# Patient Record
Sex: Male | Born: 1965 | Race: White | Hispanic: No | Marital: Single | Smoking: Never smoker
Health system: Southern US, Community
[De-identification: ages and names within clinical notes are randomized; demographics above are authoritative.]

## PROBLEM LIST (undated history)

## (undated) DIAGNOSIS — M549 Dorsalgia, unspecified: Secondary | ICD-10-CM

## (undated) DIAGNOSIS — N289 Disorder of kidney and ureter, unspecified: Secondary | ICD-10-CM

## (undated) DIAGNOSIS — M109 Gout, unspecified: Secondary | ICD-10-CM

## (undated) DIAGNOSIS — E785 Hyperlipidemia, unspecified: Secondary | ICD-10-CM

## (undated) DIAGNOSIS — N183 Chronic kidney disease, stage 3 (moderate): Secondary | ICD-10-CM

## (undated) DIAGNOSIS — R768 Other specified abnormal immunological findings in serum: Secondary | ICD-10-CM

## (undated) DIAGNOSIS — I10 Essential (primary) hypertension: Secondary | ICD-10-CM

## (undated) HISTORY — DX: Other specified abnormal immunological findings in serum: R76.8

## (undated) HISTORY — PX: OTHER SURGICAL HISTORY: SHX169

## (undated) HISTORY — DX: Chronic kidney disease, stage 3 (moderate): N18.3

---
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
## 1984-06-21 HISTORY — PX: OTHER SURGICAL HISTORY: SHX169

## 2007-06-22 HISTORY — PX: KIDNEY STONE SURGERY: SHX686

## 2009-08-23 ENCOUNTER — Encounter: Payer: Self-pay | Admitting: Family Medicine

## 2009-10-10 ENCOUNTER — Encounter: Payer: Self-pay | Admitting: Family Medicine

## 2010-08-24 ENCOUNTER — Encounter: Payer: Self-pay | Admitting: Family Medicine

## 2010-08-27 ENCOUNTER — Encounter: Payer: Self-pay | Admitting: Family Medicine

## 2010-08-27 ENCOUNTER — Ambulatory Visit (INDEPENDENT_AMBULATORY_CARE_PROVIDER_SITE_OTHER): Payer: 59 | Admitting: Family Medicine

## 2010-08-27 DIAGNOSIS — E669 Obesity, unspecified: Secondary | ICD-10-CM | POA: Insufficient documentation

## 2010-08-27 DIAGNOSIS — I10 Essential (primary) hypertension: Secondary | ICD-10-CM

## 2010-08-27 DIAGNOSIS — M109 Gout, unspecified: Secondary | ICD-10-CM | POA: Insufficient documentation

## 2010-08-27 DIAGNOSIS — E785 Hyperlipidemia, unspecified: Secondary | ICD-10-CM | POA: Insufficient documentation

## 2010-08-27 DIAGNOSIS — N200 Calculus of kidney: Secondary | ICD-10-CM | POA: Insufficient documentation

## 2010-08-28 LAB — CONVERTED CEMR LAB
Albumin: 5.2 g/dL (ref 3.5–5.2)
CO2: 22 meq/L (ref 19–32)
Cholesterol: 165 mg/dL (ref 0–200)
Glucose, Bld: 82 mg/dL (ref 70–99)
Sodium: 141 meq/L (ref 135–145)
Total Bilirubin: 1.2 mg/dL (ref 0.3–1.2)
Total Protein: 7.6 g/dL (ref 6.0–8.3)
Triglycerides: 247 mg/dL — ABNORMAL HIGH (ref <150)
VLDL: 49 mg/dL — ABNORMAL HIGH (ref 0–40)

## 2010-08-31 ENCOUNTER — Telehealth: Payer: Self-pay | Admitting: Family Medicine

## 2010-09-01 NOTE — Assessment & Plan Note (Signed)
Summary: NOV: Get estab.    Vital Signs:  Patient profile:   45 year old male Height:      69.75 inches Weight:      271 pounds BMI:     39.31 Pulse rate:   74 / minute BP sitting:   112 / 75  (right arm) Cuff size:   large  Vitals Entered By: Avon Gully CMA, Duncan Dull) (August 27, 2010 2:57 PM) CC: NP-est care   CC:  NP-est care.  History of Present Illness: Used to [facility] since 1997 for his primary care.  he is here today to establish primary care. He does have a diagnosis of hypertension and gout. Also dyslipidemia. He has been on his current medication regimen for some time. He reports his last set of lab work was approximately 6 months ago. He is having his records forwarded here which should include his vaccination records. He does think that his last tetanus vaccine was approximately 7 years ago.  Habits & Providers  Alcohol-Tobacco-Diet     Alcohol drinks/day: 0  Exercise-Depression-Behavior     Does Patient Exercise: no     STD Risk: never     Drug Use: no  Current Medications (verified): 1)  Diovan 160 Mg Tabs (Valsartan) .... Take One Tablet By Mouth Once A Day 2)  Lipitor 20 Mg Tabs (Atorvastatin Calcium) .... Take One Tablet By Mouth Once Daily 3)  Allopurinol 100 Mg Tabs (Allopurinol) .... Take One Tablet Once Daily 4)  Baby Aspirin 81 Mg Chew (Aspirin) .... Take One Tablet By Mouth Once A Day 5)  Sodium Bicarbonate 600 Mg Tabs (Sodium Bicarbonate) .... Take One Tablet By Mouth Once A Day 6)  Hydrochlorothiazide 25 Mg Tabs (Hydrochlorothiazide) .... Take One Tablet By Mouth Once A Day  Allergies (verified): No Known Drug Allergies  Comments:  Nurse/Medical Assistant: The patient's medications and allergies were reviewed with the patient and were updated in the Medication and Allergy Lists. Avon Gully CMA, Duncan Dull) (August 27, 2010 3:02 PM)  Past History:  Past Medical History: On sodium bicarb for renal stones Dr. Rosey Bath (Nephrology)  Dr.  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  Alcohol use-no Drug use-no Regular exercise-no Does Patient Exercise:  no STD Risk:  never Drug Use:  no  Review of Systems       No fever/sweats/weakness, unexplained weight loss/gain.  + vison changes.  No difficulty hearing/ringing in ears, + hay fever/allergies.  No chest pain/discomfort, palpitations.  No Br lump/nipple discharge.  No cough/wheeze.  No blood in BM, nausea/vomiting/diarrhea.  No nighttime urination, leaking urine, unusual vaginal bleeding, discharge (penis or vagina).  No muscle/joint pain. No rash, change in mole.  No HA, memory loss.  No anxiety, sleep d/o, depression.  No easy bruising/bleeding, unexplained lump   Physical Exam  General:  Well-developed,well-nourished,in no acute distress; alert,appropriate and cooperative throughout examination.  he is obese Head:  Normocephalic and atraumatic without obvious abnormalities. No apparent alopecia or balding. Eyes:  No corneal or conjunctival inflammation noted. EOMI. Perrla.  Mouth:  Oral mucosa and oropharynx without lesions or exudates.  Teeth in good repair. Neck:  No deformities, masses, or tenderness noted.  no thyromegaly Heart:  Normal rate and regular rhythm. S1 and S2 normal without gallop, murmur, click, rub or other extra sounds. no carotid or abdominal bruits Abdomen:  Bowel sounds positive,abdomen soft and non-tender without masses, organomegaly or hernias noted. Skin:  no rashes.   Cervical Nodes:  No lymphadenopathy noted Psych:  Cognition and judgment appear intact. Alert and cooperative with normal attention span and concentration. No apparent delusions, illusions,  hallucinations   Impression & Recommendations:  Problem # 1:  HYPERTENSION, BENIGN (ICD-401.1)  his blood pressure looks fantastic today. Continue current regimen. His updated medication list for this problem includes:    Diovan 160 Mg Tabs (Valsartan) .Marland Kitchen... Take one tablet by mouth once a day    Hydrochlorothiazide 25 Mg Tabs (Hydrochlorothiazide) .Marland Kitchen... Take one tablet by mouth once a day  Orders: T-Comprehensive Metabolic Panel [id number]) T-Lipid Profile ([id number])  BP today: 112/75  Problem # 2:  HYPERLIPIDEMIA (ICD-272.4)  note he is currently quartering and 80 mg tab. This was a cold bottle of his mother's. He is to recheck his levels to make sure he is at goal. I did let him know the Lipitor is now generic and thus will likely be more affordable when he gets a new prescription. His updated medication list for this problem includes:    Lipitor 20 Mg Tabs (Atorvastatin calcium) .Marland Kitchen... Take one tablet by mouth once daily  Orders: T-Comprehensive Metabolic Panel [id number]) T-Lipid Profile ([id number])  Problem # 3:  GOUT, UNSPECIFIED (ICD-274.9)  he is currently well-controlled. 2 check uric acid. His updated medication list for this problem includes:    Allopurinol 100 Mg Tabs (Allopurinol) .Marland Kitchen... Take one tablet once daily  Orders: T-Comprehensive Metabolic Panel [id number]) T-Lipid Profile ([id number])  Problem # 4:  OBESITY (ICD-278.00)  we discussed potentially scheduling him with a nutritionist since he needs a low salt diet for his high blood pressure and a low purine diet for his gout. In addition to the fact that he would like to lose weight. Patient says she will think about this and check with his insurance and he'll let me know. I did encourage him to schedule a physical in the next couple of months.  Complete Medication List: 1)  Diovan 160 Mg Tabs (Valsartan) .... Take one tablet by mouth once a day 2)  Lipitor 20 Mg Tabs (Atorvastatin calcium) ....  Take one tablet by mouth once daily 3)  Allopurinol 100 Mg Tabs (Allopurinol) .... Take one tablet once daily 4)  Baby Aspirin 81 Mg Chew (Aspirin) .... Take one tablet by mouth once a day 5)  Sodium Bicarbonate 600 Mg Tabs (sodium Bicarbonate)  .... Take one tablet by mouth once a day 6)  Hydrochlorothiazide 25 Mg Tabs (Hydrochlorothiazide) .... Take one tablet by mouth once a day  Other Orders: T-Uric Acid (Blood) ([id number])  Patient Instructions: 1)  Please schedule a follow-up appointment in 6 months for your Blood pressure and cholesterol.    Orders Added: 1)  T-Comprehensive Metabolic Panel [[id number]] 2)  T-Lipid Profile [[id number]] 3)  T-Uric Acid (Blood) [[id number]] 4)  New Patient Level III [51884]

## 2010-09-08 NOTE — Progress Notes (Signed)
Summary: allopurinol Rx  Phone Note Refill Request Call back at Home Phone [phone] Message from:  Patient on August 31, 2010 9:12 AM  Refills Requested: Medication #1:  ALLOPURINOL 300 MG TABS Take 1/2 tablet by mouth once a day for 2 weeks then inc to whole tab.   Brand Name Necessary? No   Supply Requested: 1 month pt would like to take 100 MG twice a day, pls contact patient if dosage is incorrect    Method Requested: Electronic Initial call taken by: Lannette Donath,  August 31, 2010 9:12 AM  Follow-up for Phone Call        That is fine. Will change.  Follow-up by: Nani Gasser MD,  August 31, 2010 9:44 AM  Additional Follow-up for Phone Call Additional follow up Details #1::        pt notified Additional Follow-up by: Avon Gully CMA, Duncan Dull),  September 01, 2010 11:53 AM    New/Updated Medications: ALLOPURINOL 100 MG TABS (ALLOPURINOL) Take 1 tablet by mouth two times a day Prescriptions: ALLOPURINOL 100 MG TABS (ALLOPURINOL) Take 1 tablet by mouth two times a day  #60 x 2   Entered and Authorized by:   Nani Gasser MD   Signed by:   Nani Gasser MD on 08/31/2010   Method used:   Electronically to        [facility] [phone]* (retail)       [address] [place], [place]  [address]       Ph: [phone] or [phone]       Fax: [phone]   RxID:   [id number]

## 2010-09-08 NOTE — Letter (Signed)
Summary: [facility] [place]   Imported By: Lanelle Bal 09/03/2010 10:16:13  _____________________________________________________________________  External Attachment:    Type:   Image     Comment:   External Document

## 2010-09-08 NOTE — Letter (Signed)
Summary: Patient Information Form  Patient Information Form   Imported By: Maryln Gottron 08/31/2010 15:08:57  _____________________________________________________________________  External Attachment:    Type:   Image     Comment:   External Document

## 2010-09-08 NOTE — Letter (Signed)
Summary: Registration, Billing Information   Registration, Billing Information   Imported By: Maryln Gottron 08/31/2010 15:12:27  _____________________________________________________________________  External Attachment:    Type:   Image     Comment:   External Document

## 2010-10-30 ENCOUNTER — Encounter: Payer: Self-pay | Admitting: Emergency Medicine

## 2010-10-30 ENCOUNTER — Inpatient Hospital Stay (INDEPENDENT_AMBULATORY_CARE_PROVIDER_SITE_OTHER)
Admission: RE | Admit: 2010-10-30 | Discharge: 2010-10-30 | Disposition: A | Discharge status: Home / Self Care | Payer: BC Managed Care – PPO | Source: Ambulatory Visit | Attending: Emergency Medicine | Admitting: Emergency Medicine

## 2010-10-30 DIAGNOSIS — M549 Dorsalgia, unspecified: Secondary | ICD-10-CM

## 2010-10-30 DIAGNOSIS — N200 Calculus of kidney: Secondary | ICD-10-CM

## 2010-10-30 DIAGNOSIS — I10 Essential (primary) hypertension: Secondary | ICD-10-CM | POA: Insufficient documentation

## 2010-10-30 LAB — CONVERTED CEMR LAB
Bilirubin Urine: NEGATIVE
Glucose, Urine, Semiquant: NEGATIVE
Protein, U semiquant: NEGATIVE
Urobilinogen, UA: 0.2
WBC Urine, dipstick: NEGATIVE

## 2010-11-13 ENCOUNTER — Ambulatory Visit (INDEPENDENT_AMBULATORY_CARE_PROVIDER_SITE_OTHER): Payer: BC Managed Care – PPO | Admitting: Family Medicine

## 2010-11-13 ENCOUNTER — Encounter: Payer: Self-pay | Admitting: Family Medicine

## 2010-11-13 DIAGNOSIS — L989 Disorder of the skin and subcutaneous tissue, unspecified: Secondary | ICD-10-CM

## 2010-11-13 DIAGNOSIS — M109 Gout, unspecified: Secondary | ICD-10-CM

## 2010-11-13 DIAGNOSIS — I10 Essential (primary) hypertension: Secondary | ICD-10-CM

## 2010-11-13 DIAGNOSIS — N200 Calculus of kidney: Secondary | ICD-10-CM

## 2010-11-13 DIAGNOSIS — E785 Hyperlipidemia, unspecified: Secondary | ICD-10-CM

## 2010-11-13 MED ORDER — ATORVASTATIN CALCIUM 40 MG PO TABS: 20.0000 mg | ORAL_TABLET | Freq: Every day | ORAL | Status: DC

## 2010-11-13 MED ORDER — ALLOPURINOL 100 MG PO TABS: 100.0000 mg | ORAL_TABLET | Freq: Two times a day (BID) | ORAL | Status: DC

## 2010-11-13 MED ORDER — VALSARTAN 160 MG PO TABS: 160.0000 mg | ORAL_TABLET | Freq: Every day | ORAL | Status: DC

## 2010-11-13 MED ORDER — ALLOPURINOL 100 MG PO TABS: 100.0000 mg | ORAL_TABLET | Freq: Every day | ORAL | Status: DC

## 2010-11-13 MED ORDER — SODIUM BICARBONATE 650 MG PO TABS: 650.0000 mg | ORAL_TABLET | Freq: Two times a day (BID) | ORAL | Status: DC

## 2010-11-13 MED ORDER — VALSARTAN 320 MG PO TABS: 160.0000 mg | ORAL_TABLET | Freq: Every day | ORAL | Status: DC

## 2010-11-13 MED ORDER — SODIUM BICARBONATE 325 MG PO TABS: 600.0000 mg | ORAL_TABLET | Freq: Every day | ORAL | Status: DC

## 2010-11-13 NOTE — Assessment & Plan Note (Signed)
Since he is unable to take fish oil to help reduce his triglycerides and I did recommend working on regular exercise and cutting back on fast food. He said his heart he made some changes in that direction and encouraged him to continue to do so. We can recheck his triglycerides in 6 months. If not a colon can consider adding TriCor. Continue the statin.

## 2010-11-13 NOTE — Progress Notes (Signed)
  Subjective:    Patient ID: Jared Harper, male    DOB: [birth year], 45 y.o.   MRN: [id number]  Hypertension This is a chronic problem. The current episode started more than 1 year ago. The problem is unchanged. The problem is controlled. Pertinent negatives include no chest pain, palpitations, peripheral edema or shortness of breath. There are no associated agents to hypertension. Risk factors for coronary artery disease include no known risk factors. Past treatments include ACE inhibitors.   At last OV went up to 2 tabs on his allopurinol. He is doing well with this. He has not had any gout flare since I last saw him. He has been tolerating the medication well without any side effects on increased dose.  Hyperlipidemia - Unable to tolerate fish oil. Gives him bad gas. Says he knows he eats too much fastfood.  He is on jobs all the time. Not exercising regularly.  Has lost 4 lbs. he continues his Lipitor and his last LDL and total cholesterol were well controlled.  He would also like to have a Urology referral. Used to see Dr. Clenton Pare for his kidney stones  He also has skin lesion on the top for his head that was followed at [facility]. Was there for the last 3 years. Would like me to measure it and track it.   Review of Systems  Respiratory: Negative for shortness of breath.   Cardiovascular: Negative for chest pain and palpitations.       Objective:   Physical Exam  Constitutional: He is oriented to person, place, and time. He appears well-developed and well-nourished.  HENT:  Head: Normocephalic and atraumatic.  Eyes: Conjunctivae are normal.  Neck: Neck supple. No thyromegaly present.  Cardiovascular: Normal rate, regular rhythm and normal heart sounds.   Pulmonary/Chest: Effort normal and breath sounds normal.  Lymphadenopathy:    He has no cervical adenopathy.  Neurological: He is alert and oriented to person, place, and time.  Skin: Skin is warm and dry.  Psychiatric: He  has a normal mood and affect. His behavior is normal. Judgment and thought content normal.          Assessment & Plan:

## 2010-11-13 NOTE — Assessment & Plan Note (Signed)
Well-controlled on increased dose of allopurinol. Recheck uric acid level today to make sure he is to call. He says he doesn't need a refill on his colchicine quite yet.

## 2010-11-13 NOTE — Assessment & Plan Note (Signed)
Blood pressure is well-controlled today. Continue current regimen. I did send over 90 day prescription with a coupon part which makes a $25 for 90 day supply. This will make it much more affordable. He i have done well on this medications and I would hate to change it.

## 2010-11-17 ENCOUNTER — Telehealth: Payer: Self-pay | Admitting: Family Medicine

## 2010-11-17 NOTE — Telephone Encounter (Signed)
Call patient: Uric acid is down to 6.5 which is perfect. Continue current regimen.

## 2010-11-17 NOTE — Telephone Encounter (Signed)
Pt.notified

## 2011-04-06 ENCOUNTER — Telehealth: Payer: Self-pay | Admitting: Family Medicine

## 2011-04-06 NOTE — Telephone Encounter (Signed)
Pt calling to check the last time he had his kidney function scheduled and done.  Also needed an appt scheduled for the kidney function and herniated disk. Plan:  Appt scheduled. Jarvis Newcomer, LPN Domingo Dimes

## 2011-04-16 ENCOUNTER — Ambulatory Visit (INDEPENDENT_AMBULATORY_CARE_PROVIDER_SITE_OTHER): Payer: BC Managed Care – PPO | Admitting: Family Medicine

## 2011-04-16 ENCOUNTER — Encounter: Payer: Self-pay | Admitting: Family Medicine

## 2011-04-16 VITALS — BP 114/79 | HR 73 | Wt 272.0 lb

## 2011-04-16 DIAGNOSIS — M5440 Lumbago with sciatica, unspecified side: Secondary | ICD-10-CM

## 2011-04-16 DIAGNOSIS — N189 Chronic kidney disease, unspecified: Secondary | ICD-10-CM

## 2011-04-16 DIAGNOSIS — M5430 Sciatica, unspecified side: Secondary | ICD-10-CM

## 2011-04-16 DIAGNOSIS — E785 Hyperlipidemia, unspecified: Secondary | ICD-10-CM

## 2011-04-16 DIAGNOSIS — N1832 Chronic kidney disease, stage 3b: Secondary | ICD-10-CM | POA: Insufficient documentation

## 2011-04-16 DIAGNOSIS — Z23 Encounter for immunization: Secondary | ICD-10-CM

## 2011-04-16 HISTORY — DX: Chronic kidney disease, stage 3b: N18.32

## 2011-04-16 MED ORDER — CYCLOBENZAPRINE HCL 10 MG PO TABS: 10.0000 mg | ORAL_TABLET | Freq: Every evening | ORAL | Status: DC | PRN

## 2011-04-16 NOTE — Progress Notes (Signed)
  Subjective:    Patient ID: Jared Harper, male    DOB: June 12, 1966, 45 y.o.   MRN: [id number]  HPI Hx of low back pain.  Says was told in the past he has a herniated disc. Had an xray done at [facility] about 1-2 years ago.  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  Pain shoots into the right leg from the buttock to the knee.  He is overweight.  No meds.  Using some heat. Not doing his stretches. Standing seems to help his pain.  NEver completed formal PT. Used muscle relaxers at one time.   CKD- He wants to check his kidney function. He has been avoiding NSAID and is trying to eat healthy.   Review of Systems     Objective:   Physical Exam  Constitutional: He is oriented to person, place, and time. He appears well-developed and well-nourished.  Musculoskeletal: He exhibits no edema.       Low back with nl flexion, extension, rotation right and left and side bending. Nontender over the lumbar spine. Tightness over the parapsinous muscles and mild tenderness. Neg Si joint tenderness.  Neg straight leg raise. Hip, knee and ankle strength is 5/5.  Neurological: He is alert and oriented to person, place, and time.  Skin: Skin is warm and dry.  Psychiatric: He has a normal mood and affect. His behavior is normal.          Assessment & Plan:  Low Back pain with sciatica - New dx. Discussed tx with PT. He wanted to start with home stretches first. Can use Tylenol for pain relief. Will add muscle relaxer at night adn use heat before the stretches. B/O kidney dz hold on NSAIDs for now.  Note, + hx for gout.   CKD- due to recheck kidney function. He is also due for lipid screeningn. Lab slip given   Flu vaccine given today.

## 2011-04-16 NOTE — Patient Instructions (Signed)
Can use the muscle relaxer in the evening Apply heat for about 15 min at a time and then do your gentle stretches.  If not improving over the next 3 weeks then call and we will put you into physical therapy.

## 2011-04-17 LAB — COMPREHENSIVE METABOLIC PANEL
ALT: 47 U/L (ref 0–53)
AST: 30 U/L (ref 0–37)
BUN: 17 mg/dL (ref 6–23)
Calcium: 10 mg/dL (ref 8.4–10.5)
Chloride: 102 mEq/L (ref 96–112)
Creat: 1.39 mg/dL — ABNORMAL HIGH (ref 0.50–1.35)
Total Bilirubin: 0.9 mg/dL (ref 0.3–1.2)

## 2011-04-17 LAB — LIPID PANEL
Cholesterol: 161 mg/dL (ref 0–200)
HDL: 31 mg/dL — ABNORMAL LOW (ref >39)
Total CHOL/HDL Ratio: 5.2 Ratio
VLDL: 57 mg/dL — ABNORMAL HIGH (ref 0–40)

## 2011-05-07 ENCOUNTER — Ambulatory Visit: Payer: BC Managed Care – PPO | Admitting: Family Medicine

## 2011-05-07 DIAGNOSIS — Z0289 Encounter for other administrative examinations: Secondary | ICD-10-CM

## 2011-05-18 ENCOUNTER — Other Ambulatory Visit: Payer: Self-pay | Admitting: Family Medicine

## 2011-05-24 NOTE — Progress Notes (Signed)
Summary: POSSIBLE KIDNEY STONE? rm 4   Vital Signs:  Patient Profile:   45 Years Old Male CC:      possible kidney stone x 5 days Height:     69.75 inches Weight:      277 pounds O2 Sat:      98 % O2 treatment:    Room Air Temp:     98.0 degrees F oral Pulse rate:   112 / minute Resp:     18 per minute BP sitting:   123 / 81  (left arm) Cuff size:   large  Vitals Entered By: Clemens Catholic LPN (Oct 30, 2010 10:42 AM)                  Updated Prior Medication List: DIOVAN 160 MG TABS (VALSARTAN) take one tablet by mouth once a day LIPITOR 20 MG TABS (ATORVASTATIN CALCIUM) take one tablet by mouth once daily ALLOPURINOL 100 MG TABS (ALLOPURINOL) Take 1 tablet by mouth two times a day BABY ASPIRIN 81 MG CHEW (ASPIRIN) take one tablet by mouth once a day * SODIUM BICARBONATE 600 MG TABS (SODIUM BICARBONATE) take one tablet by mouth once a day FISH OIL 1000 MG CAPS (OMEGA-3 FATTY ACIDS) 3 caps by mouth daily  Current Allergies (reviewed today): No known allergies History of Present Illness History from: patient Chief Complaint: possible kidney stone x 5 days History of Present Illness: Patient with back pain on the R side.  He has a history of kidney stones on that side and was told by his urologist that there are more.  He hasn't noticed any hematuria or dysuria or other UTI symptoms.  In the past he has also had a creatinine level of 9 during one of his stones.  He doesn't recall any trauma, heavy lifting, or other reason why he would have back pain.  He has been using Tylenol and heating pad which helps a little.  No F/C/N/V.  Pain doesn't radiate.  He also has a history of a slipped disc but is unsure which level.  REVIEW OF SYSTEMS Constitutional Symptoms      Denies fever, chills, night sweats, weight loss, weight gain, and fatigue.  Eyes       Denies change in vision, eye pain, eye discharge, glasses, contact lenses, and eye surgery. Ear/Nose/Throat/Mouth       Denies  hearing loss/aids, change in hearing, ear pain, ear discharge, dizziness, frequent runny nose, frequent nose bleeds, sinus problems, sore throat, hoarseness, and tooth pain or bleeding.  Respiratory       Denies dry cough, productive cough, wheezing, shortness of breath, asthma, bronchitis, and emphysema/COPD.  Cardiovascular       Denies murmurs, chest pain, and tires easily with exhertion.    Gastrointestinal       Denies stomach pain, nausea/vomiting, diarrhea, constipation, blood in bowel movements, and indigestion. Genitourniary       Denies painful urination, kidney stones, and loss of urinary control. Neurological       Denies paralysis, seizures, and fainting/blackouts. Musculoskeletal       Denies muscle pain, joint pain, joint stiffness, decreased range of motion, redness, swelling, muscle weakness, and gout.  Skin       Denies bruising, unusual mles/lumps or sores, and hair/skin or nail changes.  Psych       Denies mood changes, temper/anger issues, anxiety/stress, speech problems, depression, and sleep problems. Other Comments: pt c/o RT sided LBP x 5 days. hx of kidney stones.  no pain with urination, no blood in his urine. Tuesday AM he had a little pain with urination ? passed small stone. he has taken Tylenol ex strength and used a heating pad.   Past History:  Past Medical History: On sodium bicarb for renal stones Dr. Rosey Bath (Nephrology)  Dr. Clenton Pare (Urology) Gout Hyperlipidemia Hypertension  Past Surgical History: Hand srugeyr 1979, left hand carpel tunnel Thyroid ductal cyst 1985 Cyst, benign on leg 1986 laser for kidney stone 2009  Family History: Reviewed history from 08/27/2010 and no changes required. Aunt with BrCA Brother wht DM GF with DM Uncle with stroke MGP wtih Hi chol  Social History: Reviewed history from 08/27/2010 and no changes required. 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  Alcohol use-no Drug  use-no Regular exercise-no Physical Exam General appearance: well developed, well nourished, no acute distress Abdomen: soft, non-tender without obvious organomegaly Back: mild CVA tendernes R flank, no midline spinal pain MSE: oriented to time, place, and person Assessment New Problems: BACK PAIN (ICD-724.5) HYPERTENSION (ICD-401.9)   Patient Education: Patient and/or caregiver instructed in the following: rest, fluids.  Plan New Orders: New Patient Level III [99203] T-Basic Metabolic Panel [phone] Planning Comments:   Pt with back pain.  UA shows trace blood.  DDx included kidney stone vs lumbar muscle strain.  Continue heating pad and Tylenol.  He can't take NSAIDs due to kidney problems but has taken Norco in the past for stones but not for 3 years.  No meds given today.  I gave him a strainer to use to see if he can catch any sediment.  Numbers given for urologists locally.  Also will check a STAT BMP to see his creatinine level.      The patient and/or caregiver has been counseled thoroughly with regard to medications prescribed including dosage, schedule, interactions, rationale for use, and possible side effects and they verbalize understanding.  Diagnoses and expected course of recovery discussed and will return if not improved as expected or if the condition worsens. Patient and/or caregiver verbalized understanding.   Orders Added: 1)  New Patient Level III [99203] 2)  T-Basic Metabolic Panel [phone]    Laboratory Results   Urine Tests  Date/Time Received: Oct 30, 2010 11:00 AM  Date/Time Reported: Oct 30, 2010 11:00 AM   Routine Urinalysis   Color: yellow Appearance: Clear Glucose: negative   (Normal Range: Negative) Bilirubin: negative   (Normal Range: Negative) Ketone: negative   (Normal Range: Negative) Spec. Gravity: 1.025   (Normal Range: 1.003-1.035) Blood: trace-lysed   (Normal Range: Negative) pH: 5.5   (Normal Range: 5.0-8.0) Protein:  negative   (Normal Range: Negative) Urobilinogen: 0.2   (Normal Range: 0-1) Nitrite: negative   (Normal Range: Negative) Leukocyte Esterace: negative   (Normal Range: Negative)

## 2011-07-07 ENCOUNTER — Other Ambulatory Visit: Payer: Self-pay | Admitting: Family Medicine

## 2012-01-22 ENCOUNTER — Other Ambulatory Visit: Payer: Self-pay | Admitting: Family Medicine

## 2012-01-24 NOTE — Telephone Encounter (Signed)
Must make appointment 

## 2012-03-15 ENCOUNTER — Ambulatory Visit (INDEPENDENT_AMBULATORY_CARE_PROVIDER_SITE_OTHER): Payer: BC Managed Care – PPO | Admitting: Family Medicine

## 2012-03-15 ENCOUNTER — Encounter: Payer: Self-pay | Admitting: Family Medicine

## 2012-03-15 VITALS — BP 114/81 | HR 67 | Wt 273.0 lb

## 2012-03-15 DIAGNOSIS — Z23 Encounter for immunization: Secondary | ICD-10-CM

## 2012-03-15 DIAGNOSIS — M109 Gout, unspecified: Secondary | ICD-10-CM

## 2012-03-15 DIAGNOSIS — E785 Hyperlipidemia, unspecified: Secondary | ICD-10-CM

## 2012-03-15 DIAGNOSIS — I10 Essential (primary) hypertension: Secondary | ICD-10-CM

## 2012-03-15 DIAGNOSIS — Z131 Encounter for screening for diabetes mellitus: Secondary | ICD-10-CM

## 2012-03-15 LAB — COMPLETE METABOLIC PANEL WITH GFR
ALT: 60 U/L — ABNORMAL HIGH (ref 0–53)
BUN: 14 mg/dL (ref 6–23)
CO2: 27 mEq/L (ref 19–32)
Calcium: 9.9 mg/dL (ref 8.4–10.5)
Chloride: 104 mEq/L (ref 96–112)
Creat: 1.44 mg/dL — ABNORMAL HIGH (ref 0.50–1.35)
GFR, Est African American: 66 mL/min
GFR, Est Non African American: 57 mL/min — ABNORMAL LOW
Glucose, Bld: 86 mg/dL (ref 70–99)
Total Bilirubin: 1 mg/dL (ref 0.3–1.2)

## 2012-03-15 LAB — POCT URINALYSIS DIPSTICK
Bilirubin, UA: NEGATIVE
Blood, UA: NEGATIVE
Leukocytes, UA: NEGATIVE
Nitrite, UA: NEGATIVE
Protein, UA: NEGATIVE
pH, UA: 6

## 2012-03-15 LAB — LIPID PANEL
Cholesterol: 135 mg/dL (ref 0–200)
HDL: 33 mg/dL — ABNORMAL LOW (ref >39)
LDL Cholesterol: 64 mg/dL (ref 0–99)
Triglycerides: 189 mg/dL — ABNORMAL HIGH (ref <150)

## 2012-03-15 LAB — HEMOGLOBIN A1C
Hgb A1c MFr Bld: 5.5 % (ref <5.7)
Mean Plasma Glucose: 111 mg/dL (ref <117)

## 2012-03-15 MED ORDER — CYCLOBENZAPRINE HCL 10 MG PO TABS: 10.0000 mg | ORAL_TABLET | Freq: Two times a day (BID) | ORAL | Status: DC | PRN

## 2012-03-15 MED ORDER — ATORVASTATIN CALCIUM 40 MG PO TABS: 40.0000 mg | ORAL_TABLET | Freq: Every day | ORAL | Status: DC

## 2012-03-15 MED ORDER — VALSARTAN 160 MG PO TABS: 160.0000 mg | ORAL_TABLET | Freq: Every day | ORAL | Status: DC

## 2012-03-15 MED ORDER — COLCHICINE 0.6 MG PO TABS: 0.6000 mg | ORAL_TABLET | Freq: Every day | ORAL | Status: DC

## 2012-03-15 MED ORDER — ALLOPURINOL 100 MG PO TABS: 100.0000 mg | ORAL_TABLET | Freq: Two times a day (BID) | ORAL | Status: DC

## 2012-03-15 NOTE — Progress Notes (Signed)
  Subjective:    Patient ID: Jared Harper, male    DOB: 09-May-1966, 46 y.o.   MRN: [id number]  HPI HTN- No CP or SOB.  Getting some exercise. Taking meds without SE.    Hyperlipidemia - doing well on current medications. No myalgias. He has not been eating the best of the last week or 2 because he went on a cruise. He has been trying to walk 30 minutes every evening.  He also notes that his urine has smelled like sugar smacks for the last 4-5 months. He says this is new. He denies any foul smell to the order. He denies any polyuria or polydipsia. He does have a strong family history of diabetes but he has never been diagnosed with diabetes himself.  Gout - no recent flaresReview of Systems     Objective:   Physical Exam  Constitutional: He is oriented to person, place, and time. He appears well-developed and well-nourished.  HENT:  Head: Normocephalic and atraumatic.  Cardiovascular: Normal rate, regular rhythm and normal heart sounds.   Pulmonary/Chest: Effort normal and breath sounds normal.  Neurological: He is alert and oriented to person, place, and time.  Skin: Skin is warm and dry.  Psychiatric: He has a normal mood and affect. His behavior is normal.          Assessment & Plan:  HTN - Well controlled. Continue current regimen. Scription sent to pharmacy. Coupon card given for the Diovan. Follow up in 6 months.  Hyperlipidemai - recheck lipids Lahey still well controlled. Refill sent to pharmacy. Continue work on diet and exercise and weight loss.  Sweet smelling urine-Check urinalysis and hemoglobin A1c to rule out diabetes.  Gout-he's doing very well. No recent flares. He takes his allopurinol 2 tabs daily and only use the colchicine for rescue. Refills sent to pharmacy. Recheck uric acid level.  Flu shot given.

## 2012-05-09 ENCOUNTER — Telehealth: Payer: Self-pay | Admitting: Family Medicine

## 2012-05-09 NOTE — Telephone Encounter (Signed)
Please call patient and see if he is still noting seeing a sweet smell to his urine and or if it has resolved.

## 2012-05-10 NOTE — Telephone Encounter (Signed)
OK, I think likely he if fine but if becomes more persistant then I can send him to urology for further evaluation.

## 2012-05-10 NOTE — Telephone Encounter (Signed)
Patient states the smell comes and goes. He is feeling better now that he is taking a teaspoon of honey BID.

## 2012-05-11 NOTE — Telephone Encounter (Signed)
Left message on pt.'s vm.

## 2012-08-01 ENCOUNTER — Encounter: Payer: Self-pay | Admitting: *Deleted

## 2012-08-01 ENCOUNTER — Emergency Department (INDEPENDENT_AMBULATORY_CARE_PROVIDER_SITE_OTHER)
Admission: EM | Admit: 2012-08-01 | Discharge: 2012-08-01 | Disposition: A | Discharge status: Other Acute Inpatient Hospital | Payer: BC Managed Care – PPO | Source: Home / Self Care | Attending: Family Medicine | Admitting: Family Medicine

## 2012-08-01 DIAGNOSIS — R079 Chest pain, unspecified: Secondary | ICD-10-CM

## 2012-08-01 HISTORY — DX: Hyperlipidemia, unspecified: E78.5

## 2012-08-01 HISTORY — DX: Disorder of kidney and ureter, unspecified: N28.9

## 2012-08-01 HISTORY — DX: Gout, unspecified: M10.9

## 2012-08-01 HISTORY — DX: Essential (primary) hypertension: I10

## 2012-08-01 MED ORDER — NITROGLYCERIN 0.4 MG SL SUBL
0.4000 mg | SUBLINGUAL_TABLET | SUBLINGUAL | Status: DC | PRN
  Administered 2012-08-01: 0.4 mg via SUBLINGUAL

## 2012-08-01 MED ORDER — ASPIRIN 81 MG PO CHEW
324.0000 mg | CHEWABLE_TABLET | Freq: Once | ORAL | Status: AC
  Administered 2012-08-01: 324 mg via ORAL

## 2012-08-01 NOTE — ED Provider Notes (Addendum)
History     CSN: [id number]  Arrival date & time 08/01/12  1625   First MD Initiated Contact with Patient 08/01/12 1633      Chief Complaint  Patient presents with  . Chest Pain   HPI  Patient presents today with chief complaint of chest pain. Patient states chest pains are present for the past 3 days. 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 Patient states the chest pain is central nature with radiation to the neck as well as to the shoulder. No associated diaphoresis or nausea. Patient states pain is more but tightness and is 5/10 at its worse. Pain is intermittent intermittent in nature with no alleviating or aggravating factors. Pain is not precipitated by exertion and not relieved with rest. Patient states he had a stress test approximately 14-15 years ago that was normal. However, patient has not had any other cardiology followup this. Patient is taking a baby aspirin since this morning.  Past Medical History  Diagnosis Date  . Hepatitis C antibody test positive     Though has had neg tests as well.   . Hypertension   . Hyperlipidemia   . Gout   . Kidney disease     Past Surgical History  Procedure Laterality Date  . Staph infection surgery x 3    . Carpal tunnel release  1979    left   . Thyroglossal duct cyst  1985  . Cyst removal on leg  1986  . Kidney stone surgery  2009    laser    Family History  Problem Relation Age of Onset  . Breast cancer      aunt  . Diabetes Brother   . Diabetes      grandfather  . Stroke      uncel   . Hyperlipidemia Maternal Grandfather   . Heart attack Maternal Grandfather     History  Substance Use Topics  . Smoking status: Never Smoker   . Smokeless tobacco: Current User     Comment: Snuff/dip  . Alcohol Use: No      Review of Systems  All other systems reviewed and are negative.    Allergies  Review of patient's allergies indicates no known allergies.  Home  Medications   Current Outpatient Rx  Name  Route  Sig  Dispense  Refill  . allopurinol (ZYLOPRIM) 100 MG tablet   Oral   Take 1 tablet (100 mg total) by mouth 2 (two) times daily.   180 tablet   1   . aspirin 81 MG tablet   Oral   Take 81 mg by mouth daily.           Marland Kitchen atorvastatin (LIPITOR) 40 MG tablet   Oral   Take 1 tablet (40 mg total) by mouth daily.   90 tablet   3   . colchicine 0.6 MG tablet   Oral   Take 1 tablet (0.6 mg total) by mouth daily.   90 tablet   0   . cyclobenzaprine (FLEXERIL) 10 MG tablet   Oral   Take 1 tablet (10 mg total) by mouth 2 (two) times daily as needed for muscle spasms.   20 tablet   0     Must make appointment   . sodium bicarbonate 650 MG tablet   Oral   Take 1 tablet (650 mg total) by mouth 2 (two) times daily.   60 tablet   6   .  valsartan (DIOVAN) 160 MG tablet   Oral   Take 1 tablet (160 mg total) by mouth daily.   90 tablet   1     BP 131/91  Pulse 105  Wt 274 lb (124.286 kg)  BMI 39.58 kg/m2  SpO2 98%  Physical Exam  Constitutional:  Obese  HENT:  Head: Normocephalic and atraumatic.  Right Ear: External ear normal.  Left Ear: External ear normal.  Eyes: Conjunctivae are normal. Pupils are equal, round, and reactive to light.  Neck: Normal range of motion. Neck supple.  Cardiovascular: Normal rate and regular rhythm.   Pulmonary/Chest: Effort normal.  Abdominal: Soft.  Musculoskeletal: Normal range of motion.  Neurological: He is alert.  Skin: Skin is warm.    ED Course  Procedures (including critical care time)  Labs Reviewed - No data to display No results found.   1. Chest pain    EKG: NSR, no ST or T wave abnormalities.     MDM  Noted multiple cardiovascular risk factors. TIMI score of 2-3. ACS protocol activated. Patient given sublingual mitral as well as full dose aspirin and supplemental oxygen. EKG normal sinus rhythm which is reassuring however patient will need at least a set  of cardiac enzymes given cardiac risk factors. EMS contacted patient transferred to the ER via EMS.  Clinical update: Patient refuses EMS. Patient states that his brothers been taken to the ER. Discussed risks and benefits at length with patient including MI and sudden cardiac death. Patient signed AMA form.  Greater than 60 minutes spent with pt in terms of care coordination and direct patient care.   The patient and/or caregiver has been counseled thoroughly with regard to treatment plan and/or medications prescribed including dosage, schedule, interactions, rationale for use, and possible side effects and they verbalize understanding. Diagnoses and expected course of recovery discussed and will return if not improved as expected or if the condition worsens. Patient and/or caregiver verbalized understanding.             Doree Albee, MD 08/01/12 1707  Doree Albee, MD 08/01/12 1708  Doree Albee, MD 08/01/12 1714

## 2012-08-01 NOTE — ED Notes (Signed)
Patient refused EMS transport to hospital. Refusal form signed. He is being driven by his brother to [facility] ER now. Report called to charge nurse.

## 2012-08-01 NOTE — ED Notes (Signed)
Patient c/o 3 days of intermittent left sided CP that radiates to his left arm. Pain is worse with movement. Denies nausea, SOB or diaphoresis. He has a + family hx of MI with his grandparents.

## 2012-08-02 ENCOUNTER — Telehealth: Payer: Self-pay | Admitting: Family Medicine

## 2012-08-02 NOTE — Telephone Encounter (Signed)
Call patient: A see he went to the urgent care for chest pain. I want to call and see if he is doing okay this morning and to see if he would be willing to be scheduled for a treadmill stress test. Last one was over 10 years ago. Please let me know he would like to do.

## 2012-08-02 NOTE — Telephone Encounter (Signed)
Called pt.  He states he is doing ok & that he wants to hold off on the treadmill stress test due to his finances.  I am calling [facility] to get his papers from the ED visit.

## 2012-09-12 ENCOUNTER — Encounter: Payer: Self-pay | Admitting: Family Medicine

## 2012-09-12 ENCOUNTER — Ambulatory Visit (INDEPENDENT_AMBULATORY_CARE_PROVIDER_SITE_OTHER): Payer: BC Managed Care – PPO | Admitting: Family Medicine

## 2012-09-12 ENCOUNTER — Other Ambulatory Visit: Payer: Self-pay | Admitting: Family Medicine

## 2012-09-12 VITALS — BP 113/74 | HR 71 | Ht 69.6 in | Wt 284.0 lb

## 2012-09-12 DIAGNOSIS — I10 Essential (primary) hypertension: Secondary | ICD-10-CM

## 2012-09-12 DIAGNOSIS — Z23 Encounter for immunization: Secondary | ICD-10-CM

## 2012-09-12 DIAGNOSIS — R7401 Elevation of levels of liver transaminase levels: Secondary | ICD-10-CM

## 2012-09-12 DIAGNOSIS — R7309 Other abnormal glucose: Secondary | ICD-10-CM

## 2012-09-12 DIAGNOSIS — R748 Abnormal levels of other serum enzymes: Secondary | ICD-10-CM

## 2012-09-12 DIAGNOSIS — E781 Pure hyperglyceridemia: Secondary | ICD-10-CM

## 2012-09-12 DIAGNOSIS — N189 Chronic kidney disease, unspecified: Secondary | ICD-10-CM

## 2012-09-12 DIAGNOSIS — M545 Low back pain, unspecified: Secondary | ICD-10-CM

## 2012-09-12 LAB — HEMOGLOBIN A1C
Hgb A1c MFr Bld: 5.9 % — ABNORMAL HIGH (ref <5.7)
Mean Plasma Glucose: 123 mg/dL — ABNORMAL HIGH (ref <117)

## 2012-09-12 LAB — COMPLETE METABOLIC PANEL WITH GFR
Alkaline Phosphatase: 86 U/L (ref 39–117)
BUN: 17 mg/dL (ref 6–23)
Creat: 1.46 mg/dL — ABNORMAL HIGH (ref 0.50–1.35)
GFR, Est African American: 65 mL/min
GFR, Est Non African American: 56 mL/min — ABNORMAL LOW
Glucose, Bld: 87 mg/dL (ref 70–99)
Sodium: 139 mEq/L (ref 135–145)
Total Bilirubin: 0.8 mg/dL (ref 0.3–1.2)
Total Protein: 7.6 g/dL (ref 6.0–8.3)

## 2012-09-12 LAB — TRIGLYCERIDES: Triglycerides: 262 mg/dL — ABNORMAL HIGH (ref <150)

## 2012-09-12 MED ORDER — TETANUS-DIPHTH-ACELL PERTUSSIS 5-2.5-18.5 LF-MCG/0.5 IM SUSP: 0.5000 mL | Freq: Once | INTRAMUSCULAR | Status: DC

## 2012-09-12 MED ORDER — ATORVASTATIN CALCIUM 20 MG PO TABS: 20.0000 mg | ORAL_TABLET | Freq: Every day | ORAL | Status: DC

## 2012-09-12 NOTE — Progress Notes (Signed)
  Subjective:    Patient ID: Jared Harper, male    DOB: [birth year], 47 y.o.   MRN: [id number]  HPI HTN-  Pt denies chest pain, SOB, dizziness, or heart palpitations.  Taking meds as directed w/o problems.  Denies medication side effects.  Was walking for exercise but has stopped due to the cold weather. He says he knows he needs to start back.. He has started [employer] Pal program.  He does take a baby aspirin.  Gout- No flares in the last 6 month. On allopurinol. No recent changes to his regimen. Tolerating the medication well without side effects.  Also due to followup abnormal labs. Liver function was slightly off. He also has CKD and atelectatic his kidney function at least twice a year. On an ARB.   Review of Systems     Objective:   Physical Exam  Constitutional: He is oriented to person, place, and time. He appears well-developed and well-nourished.  HENT:  Head: Normocephalic and atraumatic.  Cardiovascular: Normal rate, regular rhythm and normal heart sounds.   Pulmonary/Chest: Effort normal and breath sounds normal.  Neurological: He is alert and oriented to person, place, and time.  Skin: Skin is warm and dry.  Psychiatric: He has a normal mood and affect. His behavior is normal.   10mm x 19mm hyperpigmented lesion. Has a lighter brown portion that is 22 mm wide (extending fro the 10mm darker portion)       Assessment & Plan:  HTN- Well controlled. F/U in 6 months.  Encourage more regular exercise.  He is doing a great job with his diet. Continue Diovan and baby aspirin.  Hypertriglyceridemia-due to recheck triglycerides levels.  Elevated liver enzymes - Recheck to make sure coming down. He has been trying to make some good dietary changes.   Abnormal glucose - Will do an A1C.   CKD- On ARB. Will recheck CR.   Gout- well controlled.  No flares.  Continue current regimen.   He says his low back has been bothering him recently. He wanted to notify had a  handout on some exercises he can do on his own home. Given H.O for low back exercises.

## 2012-09-14 ENCOUNTER — Encounter: Payer: Self-pay | Admitting: Family Medicine

## 2012-09-14 NOTE — Telephone Encounter (Signed)
r 

## 2012-09-17 ENCOUNTER — Encounter: Payer: Self-pay | Admitting: Family Medicine

## 2012-09-19 ENCOUNTER — Other Ambulatory Visit: Payer: Self-pay | Admitting: Family Medicine

## 2012-09-19 DIAGNOSIS — D229 Melanocytic nevi, unspecified: Secondary | ICD-10-CM

## 2012-10-30 ENCOUNTER — Other Ambulatory Visit: Payer: Self-pay | Admitting: *Deleted

## 2012-10-30 MED ORDER — COLCHICINE 0.6 MG PO TABS: 0.6000 mg | ORAL_TABLET | Freq: Every day | ORAL | Status: DC

## 2012-11-28 ENCOUNTER — Telehealth: Payer: Self-pay | Admitting: *Deleted

## 2012-11-28 DIAGNOSIS — E669 Obesity, unspecified: Secondary | ICD-10-CM

## 2012-11-28 NOTE — Telephone Encounter (Signed)
Pt would like a referral to nutrition or a dietician to help with diet and weight loss. Pt has been trying to manage this on his own however, feels that he would benefit more with help.  Dr. Linford Arnold, I wasn't sure as to whether I should do this as nutrition or as dietician?    Loralee Pacas [place]

## 2012-11-28 NOTE — Telephone Encounter (Signed)
Order placed for referral.  

## 2012-11-29 NOTE — Addendum Note (Signed)
Addended by: Deno Etienne on: 11/29/2012 05:12 PM   Modules accepted: Orders

## 2012-11-29 NOTE — Telephone Encounter (Signed)
Pt called back and stated that the nutritionist that he was referred to in not in network and informed me to call [facility] ([facility] [place])

## 2012-12-18 ENCOUNTER — Encounter: Payer: Self-pay | Admitting: Family Medicine

## 2012-12-19 ENCOUNTER — Ambulatory Visit: Payer: BC Managed Care – PPO | Admitting: Dietician

## 2013-01-08 ENCOUNTER — Encounter: Payer: Self-pay | Admitting: Physician Assistant

## 2013-01-08 ENCOUNTER — Ambulatory Visit (INDEPENDENT_AMBULATORY_CARE_PROVIDER_SITE_OTHER): Payer: BC Managed Care – PPO | Admitting: Physician Assistant

## 2013-01-08 ENCOUNTER — Encounter: Payer: Self-pay | Admitting: Sports Medicine

## 2013-01-08 ENCOUNTER — Ambulatory Visit (INDEPENDENT_AMBULATORY_CARE_PROVIDER_SITE_OTHER): Payer: BC Managed Care – PPO | Admitting: Sports Medicine

## 2013-01-08 VITALS — BP 114/80 | HR 74 | Wt 273.0 lb

## 2013-01-08 DIAGNOSIS — M109 Gout, unspecified: Secondary | ICD-10-CM

## 2013-01-08 NOTE — Progress Notes (Signed)
  Procedure: Real-time Ultrasound Guided Injection of left first metatarsophalangeal joint Device: GE Logiq E  Verbal informed consent obtained.  Time-out conducted.  Noted no overlying erythema, induration, or other signs of local infection.  Skin prepped in a sterile fashion.  Local anesthesia: Topical Ethyl chloride.  With sterile technique and under real time ultrasound guidance:  25-gauge needle advanced into joint, 0.5 cc Kenalog 40, 1 cc lidocaine injected easily. Completed without difficulty  Pain immediately resolved suggesting accurate placement of the medication.  Advised to call if fevers/chills, erythema, induration, drainage, or persistent bleeding.  Images permanently stored and available for review in the ultrasound unit.  Impression: Technically successful ultrasound guided injection.

## 2013-01-08 NOTE — Progress Notes (Addendum)
  Subjective:    Patient ID: Jared Harper, male    DOB: Jan 10, 1966, 47 y.o.   MRN: [id number]  HPI Patient is a 47 yo male who presents to the clinic with left great toe pain and swelling. Pt has a hx of gouty flares. He was very well controlled on allopurinol until he started a new diet 4 months ago that decreased meat and increased fruits and veggies and keeps his calories to 1200mg . He has a nutritionist appt for later this month. Dr. Linford Arnold wanted him to take colchine daily but he was not able to tolerate it via upset stomach. He does take for acute flares. He also has had some problems in the past with allopurinol and not being able to tolerate high doses. He has taken colchine once for this episode this am and did not improve. Last flare he took colchine until he got nausea and vomiting and pain still took 5 more days to get rid of it.      Review of Systems     Objective:   Physical Exam  Constitutional: He is oriented to person, place, and time. He appears well-developed and well-nourished.  HENT:  Head: Normocephalic and atraumatic.  Cardiovascular: Normal rate, regular rhythm and normal heart sounds.   Musculoskeletal:  Left great toe erythematous, swollen, and warm to touch. Pain to touch was more over medial aspect of great toe joint.   Neurological: He is alert and oriented to person, place, and time.  Psychiatric: He has a normal mood and affect. His behavior is normal.          Assessment & Plan:  Gout flare- Dr. Yehuda Budd came to do intrarticular great toe injection. Pt tolerated well. Last uric acid levels were good but suspect pt needs to keep uric acid levels lower probably around 5. Pt has problems tolerating allopurinol. Agrees to increase by 100mg  which would be to 200mg  in am and 100mg  at night after acute flare. Perhaps this will decrease flares. I suspect he may need to increase a little more. Discuss with nutritionist to make sure diet is in alignment to  keep gouty flares down. Follow up with Dr. Linford Arnold in 2 months. Suspect need to check uric acid and kidney function at that time.

## 2013-01-08 NOTE — Patient Instructions (Addendum)
Increase allopurinol by 100mg .

## 2013-01-08 NOTE — Assessment & Plan Note (Signed)
This is a presentation with podagra. 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 I would recommend increasing allopurinol to 300 mg twice a day once the acute flare has resolved. Ideally his uric acid levels would be closer to 5.

## 2013-01-19 ENCOUNTER — Telehealth: Payer: Self-pay | Admitting: Physician Assistant

## 2013-01-19 ENCOUNTER — Ambulatory Visit (INDEPENDENT_AMBULATORY_CARE_PROVIDER_SITE_OTHER): Payer: BC Managed Care – PPO | Admitting: Sports Medicine

## 2013-01-19 ENCOUNTER — Encounter: Payer: Self-pay | Admitting: Physician Assistant

## 2013-01-19 ENCOUNTER — Ambulatory Visit (INDEPENDENT_AMBULATORY_CARE_PROVIDER_SITE_OTHER): Payer: BC Managed Care – PPO | Admitting: Physician Assistant

## 2013-01-19 VITALS — BP 127/91 | HR 72 | Wt 274.0 lb

## 2013-01-19 DIAGNOSIS — M109 Gout, unspecified: Secondary | ICD-10-CM

## 2013-01-19 DIAGNOSIS — N183 Chronic kidney disease, stage 3 unspecified: Secondary | ICD-10-CM

## 2013-01-19 DIAGNOSIS — K1370 Unspecified lesions of oral mucosa: Secondary | ICD-10-CM

## 2013-01-19 MED ORDER — ALLOPURINOL 100 MG PO TABS: ORAL_TABLET | ORAL | Status: DC

## 2013-01-19 MED ORDER — AMBULATORY NON FORMULARY MEDICATION: Status: DC

## 2013-01-19 MED ORDER — PREDNISONE 10 MG PO TABS: ORAL_TABLET | ORAL | Status: DC

## 2013-01-19 NOTE — Progress Notes (Signed)
  Subjective:    Patient ID: Jared Harper, male    DOB: 28-Nov-1965, 47 y.o.   MRN: [id number]  HPI Patient presents to the clinic 2 weeks later for another gout episode in his right great toe. Last night the pain started and he took 1 colchine and then today he has taken 3 colchine with no relief. He did increase to allopurinol 200mg  am and 100pm. He is concerned about increasing any more due to gas. He would like another injection because it worked fast.   Pt has a sore on his gums. It is a little tender but no swelling or significant pain. He is able to pop it and it drain but then it comes back. He wonders if it is an abscess. Has also used hydrogen peroxide and has helped to make better. No fever, chills.     Review of Systems     Objective:   Physical Exam  Constitutional: He appears well-developed and well-nourished.  HENT:  Head: Normocephalic and atraumatic.  Mouth/Throat: Oropharynx is clear and moist.  Small white pustule on gums under right lower incisor. Poor dentition multiple caries.   Musculoskeletal:  Right great toe- positive fluid wave with mild erythemata and tenderness to palpation over the lateral joint.  Psychiatric: He has a normal mood and affect. His behavior is normal.          Assessment & Plan:  Gout flare/CKD- Intraarticular injection was done by Dr. Bonnita Levan. Per Dr. Karie Schwalbe a short course of prednisone was also given. Will check uric acid today as well as CMP to recheck renal function. Would like to recheck uric acid in 4 weeks. Discussed with pt to increase allupurinol 200mg  BID. Discussed to use gasx if a problem. We may need to further increase. CrCL is 110 ok for allopurinol and uloric. We may need to consider switching to uloric.Pt needs to make a nephrologist appt.     Mouth lesion- I suspect a small mucosal cyst/apthous ulcer. gave magic mouthwash combination to swish and spit 4 times a day for 7 days. Reassured I do not think abscess does not  meet standards for infection.

## 2013-01-19 NOTE — Progress Notes (Addendum)
Procedure: Real-time Ultrasound Guided Injection of left first metatarsophalangeal joint Device: GE Logiq E  Verbal informed consent obtained.  Time-out conducted.  Noted no overlying erythema, induration, or other signs of local infection.  Skin prepped in a sterile fashion.  Local anesthesia: Topical Ethyl chloride.  With sterile technique and under real time ultrasound guidance:  0.5 cc Kenalog 40, 1 cc lidocaine injected easily into the left first metatarsophalangeal joint. Completed without difficulty  Pain immediately resolved suggesting accurate placement of the medication.  Advised to call if fevers/chills, erythema, induration, drainage, or persistent bleeding.  Images permanently stored and available for review in the ultrasound unit.  Impression: Technically successful ultrasound guided injection.

## 2013-01-19 NOTE — Patient Instructions (Addendum)
Tomorrow start 200mg  twice a day. Recheck uric acid in 4 weeks.   Simethicone for gas.

## 2013-01-19 NOTE — Assessment & Plan Note (Signed)
Injection as above. We need to continue to work on getting his uric acid level down, he does get some flatulence with increased allopurinol. I'm going to add a prednisone taper. He can come back to see me on an as-needed basis, and will followup as Medical treatment with his primary care provider.

## 2013-01-19 NOTE — Telephone Encounter (Signed)
Amber patient wanted to make sure last 2 office notes as well as labs ordered today are sent to Neprhologist at [facility] Dr. Doristine Devoid sp?? If cannot find can call pt for number to send information.

## 2013-01-20 LAB — COMPLETE METABOLIC PANEL WITH GFR
ALT: 48 U/L (ref 0–53)
AST: 27 U/L (ref 0–37)
Alkaline Phosphatase: 84 U/L (ref 39–117)
Creat: 1.42 mg/dL — ABNORMAL HIGH (ref 0.50–1.35)
Sodium: 141 mEq/L (ref 135–145)
Total Bilirubin: 1 mg/dL (ref 0.3–1.2)
Total Protein: 7.5 g/dL (ref 6.0–8.3)

## 2013-01-22 NOTE — Telephone Encounter (Signed)
Labs & office notes faxed to [phone]

## 2013-02-14 ENCOUNTER — Encounter: Payer: Self-pay | Admitting: Physician Assistant

## 2013-02-16 ENCOUNTER — Ambulatory Visit: Payer: BC Managed Care – PPO | Admitting: Physician Assistant

## 2013-02-16 DIAGNOSIS — Z0289 Encounter for other administrative examinations: Secondary | ICD-10-CM

## 2013-03-15 ENCOUNTER — Ambulatory Visit: Payer: BC Managed Care – PPO | Admitting: Family Medicine

## 2013-04-15 ENCOUNTER — Other Ambulatory Visit: Payer: Self-pay | Admitting: Family Medicine

## 2013-04-16 NOTE — Telephone Encounter (Signed)
Needs f/u appt 

## 2013-04-26 ENCOUNTER — Other Ambulatory Visit: Payer: Self-pay

## 2013-06-26 ENCOUNTER — Other Ambulatory Visit: Payer: Self-pay | Admitting: Family Medicine

## 2013-06-26 ENCOUNTER — Encounter: Payer: Self-pay | Admitting: Family Medicine

## 2013-06-26 ENCOUNTER — Ambulatory Visit (INDEPENDENT_AMBULATORY_CARE_PROVIDER_SITE_OTHER): Payer: BC Managed Care – PPO | Admitting: Family Medicine

## 2013-06-26 ENCOUNTER — Other Ambulatory Visit: Payer: Self-pay | Admitting: Physician Assistant

## 2013-06-26 VITALS — BP 142/97 | HR 72 | Temp 98.1°F | Ht 69.0 in | Wt 291.0 lb

## 2013-06-26 DIAGNOSIS — I10 Essential (primary) hypertension: Secondary | ICD-10-CM

## 2013-06-26 DIAGNOSIS — M109 Gout, unspecified: Secondary | ICD-10-CM

## 2013-06-26 DIAGNOSIS — Z23 Encounter for immunization: Secondary | ICD-10-CM

## 2013-06-26 DIAGNOSIS — E785 Hyperlipidemia, unspecified: Secondary | ICD-10-CM

## 2013-06-26 DIAGNOSIS — N189 Chronic kidney disease, unspecified: Secondary | ICD-10-CM | POA: Diagnosis not present

## 2013-06-26 DIAGNOSIS — E669 Obesity, unspecified: Secondary | ICD-10-CM

## 2013-06-26 DIAGNOSIS — Z6841 Body Mass Index (BMI) 40.0 and over, adult: Secondary | ICD-10-CM

## 2013-06-26 LAB — LIPID PANEL
Cholesterol: 157 mg/dL (ref 0–200)
HDL: 34 mg/dL — AB (ref >39)
LDL CALC: 64 mg/dL (ref 0–99)
TRIGLYCERIDES: 295 mg/dL — AB (ref <150)
Total CHOL/HDL Ratio: 4.6 Ratio
VLDL: 59 mg/dL — ABNORMAL HIGH (ref 0–40)

## 2013-06-26 LAB — COMPLETE METABOLIC PANEL WITH GFR
ALT: 71 U/L — AB (ref 0–53)
AST: 36 U/L (ref 0–37)
Albumin: 4.9 g/dL (ref 3.5–5.2)
Alkaline Phosphatase: 83 U/L (ref 39–117)
BILIRUBIN TOTAL: 0.8 mg/dL (ref 0.3–1.2)
BUN: 15 mg/dL (ref 6–23)
CO2: 26 meq/L (ref 19–32)
CREATININE: 1.21 mg/dL (ref 0.50–1.35)
Calcium: 9.5 mg/dL (ref 8.4–10.5)
Chloride: 104 mEq/L (ref 96–112)
GFR, EST AFRICAN AMERICAN: 81 mL/min
GFR, Est Non African American: 70 mL/min
Glucose, Bld: 99 mg/dL (ref 70–99)
Potassium: 4.3 mEq/L (ref 3.5–5.3)
Sodium: 139 mEq/L (ref 135–145)
Total Protein: 7.6 g/dL (ref 6.0–8.3)

## 2013-06-26 LAB — URIC ACID: Uric Acid, Serum: 6.3 mg/dL (ref 4.0–7.8)

## 2013-06-26 NOTE — Progress Notes (Signed)
   Subjective:    Patient ID: Jared Harper, male    DOB: 08/07/1965, 48 y.o.   MRN: [id number]  HPI Hypertension- Pt denies chest pain, SOB, dizziness, or heart palpitations.  Taking meds as directed w/o problems.  Denies medication side effects.  Out of his meds x 3 days. Over the summer was waking 1.74miles per day.   Hyperlipidemia- no myalgias or S.E. on Lipitor 20 mg daily. Lab Results  Component Value Date   CHOL 135 03/15/2012   HDL 33* 03/15/2012   LDLCALC 64 03/15/2012   TRIG 262* 09/12/2012   CHOLHDL 4.1 03/15/2012   Gout - last report a flare was in August when he came in for an office visit. Helped or not was increased to 2 mg twice a day at that point in time. His uric acid level was 5.8. Done well since then.    Review of Systems     Objective:   Physical Exam  Constitutional: He is oriented to person, place, and time. He appears well-developed and well-nourished.  HENT:  Head: Normocephalic and atraumatic.  Cardiovascular: Normal rate, regular rhythm and normal heart sounds.   Pulmonary/Chest: Effort normal and breath sounds normal.  Neurological: He is alert and oriented to person, place, and time.  Skin: Skin is warm and dry.  Psychiatric: He has a normal mood and affect. His behavior is normal.          Assessment & Plan:  Hypertension-  encouraged more regular exercise. Started drinking sodas over the Holidays.  Hasn't had the best diet over the holidays be plans on getting back on track. Not well controlled today, the out of his medication for the last 3 days. Will send over new prescription.  F/U in 6 months.   Hyperlipidemia-due to repeat liver and lipid levels.  Lab slip given today.  CKD - due to repeat kidney function. Following every 6 months. Lab Results  Component Value Date   CREATININE 1.42* 01/19/2013   Gout - Last uric acid was well controlled.  He seems convinced that when he lost weight it was clearing his gout. He said he had 3 episodes  back-to-back after he lost them as 30 pounds. I discussed with him that sometimes rapid weight loss her major diet changes can trigger gout. But I do not think that he should not try to work on his weight because of this.  Obesity/BMI 43-discussed getting back on track with exercise and diet. He was using my fitness PAL it one time. We also discussed potential use of weight loss medications as long as blood pressure is well-controlled. We discussed the pros and cons and risks and benefits of using phentermine. He is going to think about it but wants to try get back on track with diet and exercise first.

## 2013-06-27 ENCOUNTER — Other Ambulatory Visit: Payer: Self-pay | Admitting: *Deleted

## 2013-07-03 ENCOUNTER — Ambulatory Visit (INDEPENDENT_AMBULATORY_CARE_PROVIDER_SITE_OTHER): Payer: BC Managed Care – PPO | Admitting: Physician Assistant

## 2013-07-03 ENCOUNTER — Encounter: Payer: Self-pay | Admitting: Physician Assistant

## 2013-07-03 VITALS — BP 121/80 | HR 97 | Wt 291.0 lb

## 2013-07-03 DIAGNOSIS — M109 Gout, unspecified: Secondary | ICD-10-CM

## 2013-07-03 MED ORDER — PREDNISONE 10 MG PO TABS: ORAL_TABLET | ORAL | Status: DC

## 2013-07-03 NOTE — Progress Notes (Signed)
   Subjective:    Patient ID: Jared Harper, male    DOB: 04/29/1966, 48 y.o.   MRN: [id number]  HPI Pt woke up this morning with red, warm painful right knee. Pt has hx of gout. Uric acid was in normal range at 6.3 1 week ago but had increased since last uric acid check. Pt was not able to tolerate increased allopurinol and had to go back down to 100mg  bid from 200mg  bid. He did take 2 colchine when woke up and has not helped at all. Trouble with ROM of right knee, painful weight bearing.    Review of Systems     Objective:   Physical Exam  Constitutional: He appears well-developed and well-nourished.  HENT:  Head: Normocephalic and atraumatic.  Cardiovascular: Normal rate, regular rhythm and normal heart sounds.   Musculoskeletal:  Right knee red and slightly warm to touch. Fluid wave present indicating some joint swelling. ROM decreased in all directions due to pain.           Assessment & Plan:  Gout, right knee- prednisone taper given. Injection in right knee done today. Call as needed. Continue on alloupurinol.   Knee Injection Procedure Note  Pre-operative Diagnosis: right knee pain due to gout  Post-operative Diagnosis: same  Indications: symptomatic relief due to gout  Anesthesia: ethyl chloride  Procedure Details   Verbal consent was obtained for the procedure. The joint was prepped with Betadine. Ethyl chlorde use before A 22 gauge needle was inserted into the lateral aspect of the joint.   9 ml 1% lidocaine and 1 ml of depo medrol 40 mg was then injected into the joint through the same needle. The needle was removed and the area cleansed and dressed.  Complications:  None; patient tolerated the procedure well.

## 2013-09-10 ENCOUNTER — Encounter: Payer: Self-pay | Admitting: Family Medicine

## 2013-09-10 ENCOUNTER — Ambulatory Visit (INDEPENDENT_AMBULATORY_CARE_PROVIDER_SITE_OTHER): Payer: BC Managed Care – PPO | Admitting: Family Medicine

## 2013-09-10 VITALS — BP 129/82 | HR 83 | Wt 298.0 lb

## 2013-09-10 DIAGNOSIS — K449 Diaphragmatic hernia without obstruction or gangrene: Secondary | ICD-10-CM | POA: Insufficient documentation

## 2013-09-10 DIAGNOSIS — R0683 Snoring: Secondary | ICD-10-CM

## 2013-09-10 DIAGNOSIS — M65849 Other synovitis and tenosynovitis, unspecified hand: Secondary | ICD-10-CM

## 2013-09-10 DIAGNOSIS — Z820 Family history of epilepsy and other diseases of the nervous system: Secondary | ICD-10-CM

## 2013-09-10 DIAGNOSIS — M65839 Other synovitis and tenosynovitis, unspecified forearm: Secondary | ICD-10-CM

## 2013-09-10 DIAGNOSIS — M778 Other enthesopathies, not elsewhere classified: Secondary | ICD-10-CM

## 2013-09-10 DIAGNOSIS — Z8489 Family history of other specified conditions: Secondary | ICD-10-CM

## 2013-09-10 DIAGNOSIS — R0609 Other forms of dyspnea: Secondary | ICD-10-CM

## 2013-09-10 DIAGNOSIS — R0989 Other specified symptoms and signs involving the circulatory and respiratory systems: Secondary | ICD-10-CM

## 2013-09-10 MED ORDER — MELOXICAM 15 MG PO TABS: 15.0000 mg | ORAL_TABLET | Freq: Every day | ORAL | Status: DC | PRN

## 2013-09-10 NOTE — Progress Notes (Signed)
   Subjective:    Patient ID: Jared Harper, male    DOB: 1966/03/15, 48 y.o.   MRN: [id number]  HPI Left wrist pain - left pt reports that it has been going on for about 2 wks it has been getting worse. he has not noticed any swelling,tingling, or numbness. he states that when he's lifting or turning his wrist he notices pain. Not taking any medicaitons for pain relief.  Worse wheen he first gets up in the AM . Still having pain in his left shoulder. He is a side sleeper.    Snoring - nightly.  No Am HA.  Wakes up feeling tired sometimes. No witnessed apneas.  Strong family history Mom has OSA.  occ wakes up with GERD and reflux.  Usually take peptobismol for his SXS. Not on an acid medication.   Review of Systems     Objective:   Physical Exam  Constitutional: He is oriented to person, place, and time. He appears well-developed and well-nourished.  HENT:  Head: Normocephalic and atraumatic.  Musculoskeletal:  Left wrist with normal range of motion. Strength is symmetric with flexion and extension. Positive de Quervain's. He also had some discomfort in the posterior wrist with dorsiflexion against resistance. He is nontender over the wrist joint itself. No swelling or erythema. No rash. No numbness or tingling in the fingertips or hand. Elbow with normal range of motion and nontender as well. Finger strength is 5 out of 5 in all fingers.  Neurological: He is alert and oriented to person, place, and time.  Skin: Skin is warm and dry.  Psychiatric: He has a normal mood and affect. His behavior is normal.          Assessment & Plan:  Left wrist pain-most likely tendinitis. Having pain in multiple locations of the wrist. He had a positive de Quervain's but also complains of pain in the anterior portion of the wrist and the lateral portion of the wrist. I think is putting them in a cock-up splint for the next one to 2 weeks fracture support would be helpful. He can't sleep with it he  would like since he does complain that his torn as is worse when he first gets up in the morning. He does admit to sleeping with his hand and arm underneath his pillow which is underneath his head.  H.O provided with stretches.   Snoring-will perform stopping questionnaire today. He is a very strong family history of sleep apnea with multiple family members including his mother with obstructive sleep apnea. He is worried that he test positive that his insurance won't pay for the CPAP machine. STOP BANG score of 5 ( screens high risk for OSA). Discussed that i really want him to think about screening. He says he will think about it.

## 2013-09-10 NOTE — Patient Instructions (Addendum)
For your wrist recommend wear the splint for 1-2 weeks until you wrist feels more comfortable. Ice as needed. Recommend take an anti-inflammatory. I sent a prescription for generic meloxicam. This can be taken once a day and last 24 hours. Make sure take with food and water and stop immediately if any GI upset or irritation. If you're not improving over the next 3-4 weeks then please come back into see me. Please consider getting a sleep study. This can be done in the home, and be more cost effective.   De Quervain's Disease Harriet Pho disease is a condition often seen in racquet sports where there is a soreness (inflammation) in the cord like structures (tendons) which attach muscle to bone on the thumb side of the wrist. There may be a tightening of the tissuesaround the tendons. This condition is often helped by giving up or modifying the activity which caused it. When conservative treatment does not help, surgery may be required. Conservative treatment could include changes in the activity which brought about the problem or made it worse. Anti-inflammatory medications and injections may be used to help decrease the inflammation and help with pain control. Your caregiver will help you determine which is best for you. DIAGNOSIS  Often the diagnosis (learning what is wrong) can be made by examination. Sometimes x-rays are required. HOME CARE INSTRUCTIONS   Apply ice to the sore area for 15-20 minutes, 03-04 times per day while awake. Put the ice in a plastic bag and place a towel between the bag of ice and your skin. This is especially helpful if it can be done after all activities involving the sore wrist.  Temporary splinting may help.  Only take over-the-counter or prescription medicines for pain, discomfort or fever as directed by your caregiver. SEEK MEDICAL CARE IF:   Pain relief is not obtained with medications, or if you have increasing pain and seem to be getting worse rather than  better. MAKE SURE YOU:   Understand these instructions.  Will watch your condition.  Will get help right away if you are not doing well or get worse. Document Released: 03/02/2001 Document Revised: 08/30/2011 Document Reviewed: 06/07/2005 [facility] Patient Information 2014 [place].

## 2013-12-05 ENCOUNTER — Telehealth: Payer: Self-pay | Admitting: *Deleted

## 2013-12-05 NOTE — Telephone Encounter (Signed)
Pt called and stated that his wrist pain is worse. I told him that he was to f/u with Dr. Madilyn Fireman 1 mo after his visit .Audelia Hives [place]

## 2013-12-11 ENCOUNTER — Other Ambulatory Visit: Payer: Self-pay | Admitting: Family Medicine

## 2013-12-11 ENCOUNTER — Ambulatory Visit (INDEPENDENT_AMBULATORY_CARE_PROVIDER_SITE_OTHER): Payer: BC Managed Care – PPO | Admitting: Family Medicine

## 2013-12-11 ENCOUNTER — Ambulatory Visit (INDEPENDENT_AMBULATORY_CARE_PROVIDER_SITE_OTHER): Payer: BC Managed Care – PPO

## 2013-12-11 ENCOUNTER — Encounter: Payer: Self-pay | Admitting: Family Medicine

## 2013-12-11 ENCOUNTER — Ambulatory Visit (INDEPENDENT_AMBULATORY_CARE_PROVIDER_SITE_OTHER): Payer: BC Managed Care – PPO | Admitting: Sports Medicine

## 2013-12-11 VITALS — BP 124/69 | HR 79 | Ht 69.0 in | Wt 289.0 lb

## 2013-12-11 DIAGNOSIS — M25532 Pain in left wrist: Secondary | ICD-10-CM | POA: Insufficient documentation

## 2013-12-11 DIAGNOSIS — R0989 Other specified symptoms and signs involving the circulatory and respiratory systems: Secondary | ICD-10-CM

## 2013-12-11 DIAGNOSIS — M109 Gout, unspecified: Secondary | ICD-10-CM

## 2013-12-11 DIAGNOSIS — I10 Essential (primary) hypertension: Secondary | ICD-10-CM

## 2013-12-11 DIAGNOSIS — R0683 Snoring: Secondary | ICD-10-CM

## 2013-12-11 DIAGNOSIS — E785 Hyperlipidemia, unspecified: Secondary | ICD-10-CM

## 2013-12-11 DIAGNOSIS — R748 Abnormal levels of other serum enzymes: Secondary | ICD-10-CM

## 2013-12-11 DIAGNOSIS — R0609 Other forms of dyspnea: Secondary | ICD-10-CM

## 2013-12-11 DIAGNOSIS — M25539 Pain in unspecified wrist: Secondary | ICD-10-CM

## 2013-12-11 NOTE — Progress Notes (Signed)
   Subjective:    I'm seeing this patient as a consultation for:  Dr. Madilyn Fireman  CC: Left wrist pain  HPI: This is a pleasant 48 year old male, for some time now he's had pain that he localizes over the dorsal and volar radiocarpal joint, worse with essentially any motion of the wrist. He has had NSAIDs, a thumb spica brace, but continues to have pain. I was called for further evaluation and definitive treatment. Pain is moderate, persistent. No swelling.  Past medical history, Surgical history, Family history not pertinant except as noted below, Social history, Allergies, and medications have been entered into the medical record, reviewed, and no changes needed.   Review of Systems: No headache, visual changes, nausea, vomiting, diarrhea, constipation, dizziness, abdominal pain, skin rash, fevers, chills, night sweats, weight loss, swollen lymph nodes, body aches, joint swelling, muscle aches, chest pain, shortness of breath, mood changes, visual or auditory hallucinations.   Objective:   General: Well Developed, well nourished, and in no acute distress.  Neuro/Psych: Alert and oriented x3, extra-ocular muscles intact, able to move all 4 extremities, sensation grossly intact. Skin: Warm and dry, no rashes noted.  Respiratory: Not using accessory muscles, speaking in full sentences, trachea midline.  Cardiovascular: Pulses palpable, no extremity edema. Abdomen: Does not appear distended. Left Wrist: Inspection normal with no visible erythema or swelling. ROM smooth and normal with good flexion and extension and ulnar/radial deviation that is symmetrical with opposite wrist. Palpation is normal over metacarpals, navicular, lunate, and TFCC; tendons without tenderness/ swelling Tender to palpation at the radiocarpal joint, reproduction of pain with extreme flexion and extension of the wrist. No snuffbox tenderness. No tenderness over Canal of [place]. Strength 5/5 in all directions without  pain. Negative Finkelstein, tinel's and phalens. Negative Watson's test.  Procedure: Real-time Ultrasound Guided Injection of left radiocarpal joint Device: GE Logiq E  Verbal informed consent obtained.  Time-out conducted.  Noted no overlying erythema, induration, or other signs of local infection.  Skin prepped in a sterile fashion.  Local anesthesia: Topical Ethyl chloride.  With sterile technique and under real time ultrasound guidance:  1 cc Kenalog 40, 2 cc lidocaine injected easily into the joint. Completed without difficulty  Pain immediately resolved suggesting accurate placement of the medication.  Advised to call if fevers/chills, erythema, induration, drainage, or persistent bleeding.  Images permanently stored and available for review in the ultrasound unit.  Impression: Technically successful ultrasound guided injection.  Impression and Recommendations:   This case required medical decision making of moderate complexity.

## 2013-12-11 NOTE — Assessment & Plan Note (Signed)
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 Injection as above. X-rays. In one month.

## 2013-12-11 NOTE — Progress Notes (Signed)
   Subjective:    Patient ID: Jared Harper, male    DOB: 1965/08/01, 48 y.o.   MRN: [id number]  HPI Hypertension- Pt denies chest pain, SOB, dizziness, or heart palpitations.  Taking meds as directed w/o problems.  Denies medication side effects.   Lab Results  Component Value Date   CHOL 157 06/26/2013   HDL 34* 06/26/2013   LDLCALC 64 06/26/2013   TRIG 295* 06/26/2013   CHOLHDL 4.6 06/26/2013     Snoring - I. asked him if he can't anymore about getting a sleep study. He actually says he feels like his snoring has been better since he's lost some weight has been trying to exercise. He also change the position of his bed and felt like that has helped as well. He says not sure about getting a sleep study because he is worried about the potential cost with his insurance.  Gout- no recent flares. Doing well overall. On allopurinol 200mg  QD.   Review of Systems     Objective:   Physical Exam  Constitutional: He is oriented to person, place, and time. He appears well-developed and well-nourished.  HENT:  Head: Normocephalic and atraumatic.  Cardiovascular: Normal rate, regular rhythm and normal heart sounds.   Pulmonary/Chest: Effort normal and breath sounds normal.  Neurological: He is alert and oriented to person, place, and time.  Skin: Skin is warm and dry.  Psychiatric: He has a normal mood and affect. His behavior is normal.          Assessment & Plan:  HTN- well controlled.  Due CMP. Followup in 6 months. Continue current regimen.  Severe Obesity /BMI 42- He has lost 9 lbs and is doing well. Has been walking for exercise. Great job. Continue to exercise and work on weight loss.  Snoring-encouraged him to still consider getting a sleep study. We can check with the insurance to see what coverage might be for a home study sleep test.  Gout - recheck uric acid.  No recent flares. Well controlled.  Elevated liver enzymes - due to recheck liver  enyzmes.  Hypertriglyceridemia-due to recheck lipids.

## 2013-12-11 NOTE — Assessment & Plan Note (Signed)
Rechecking uric acid levels per PCP

## 2013-12-12 ENCOUNTER — Other Ambulatory Visit: Payer: Self-pay | Admitting: Family Medicine

## 2013-12-12 ENCOUNTER — Encounter: Payer: Self-pay | Admitting: Family Medicine

## 2013-12-12 DIAGNOSIS — R748 Abnormal levels of other serum enzymes: Secondary | ICD-10-CM | POA: Insufficient documentation

## 2013-12-12 LAB — COMPLETE METABOLIC PANEL WITH GFR
ALT: 69 U/L — ABNORMAL HIGH (ref 0–53)
AST: 41 U/L — AB (ref 0–37)
Albumin: 4.5 g/dL (ref 3.5–5.2)
Alkaline Phosphatase: 76 U/L (ref 39–117)
BUN: 15 mg/dL (ref 6–23)
CALCIUM: 9.3 mg/dL (ref 8.4–10.5)
CHLORIDE: 102 meq/L (ref 96–112)
CO2: 24 meq/L (ref 19–32)
CREATININE: 1.36 mg/dL — AB (ref 0.50–1.35)
GFR, EST AFRICAN AMERICAN: 70 mL/min
GFR, EST NON AFRICAN AMERICAN: 61 mL/min
Glucose, Bld: 96 mg/dL (ref 70–99)
Potassium: 3.6 mEq/L (ref 3.5–5.3)
Sodium: 137 mEq/L (ref 135–145)
Total Bilirubin: 1 mg/dL (ref 0.2–1.2)
Total Protein: 6.9 g/dL (ref 6.0–8.3)

## 2013-12-12 LAB — LIPID PANEL
CHOLESTEROL: 136 mg/dL (ref 0–200)
HDL: 30 mg/dL — ABNORMAL LOW (ref >39)
LDL Cholesterol: 59 mg/dL (ref 0–99)
TRIGLYCERIDES: 235 mg/dL — AB (ref <150)
Total CHOL/HDL Ratio: 4.5 Ratio
VLDL: 47 mg/dL — ABNORMAL HIGH (ref 0–40)

## 2013-12-12 LAB — URIC ACID: Uric Acid, Serum: 6.1 mg/dL (ref 4.0–7.8)

## 2013-12-12 LAB — HEPATITIS PANEL, ACUTE
HCV AB: REACTIVE — AB
HEP A IGM: NONREACTIVE
HEP B C IGM: NONREACTIVE
Hepatitis B Surface Ag: NEGATIVE

## 2013-12-12 MED ORDER — TRAMADOL HCL 50 MG PO TABS: ORAL_TABLET | ORAL | Status: DC

## 2013-12-13 ENCOUNTER — Other Ambulatory Visit: Payer: Self-pay | Admitting: Family Medicine

## 2013-12-13 DIAGNOSIS — R768 Other specified abnormal immunological findings in serum: Secondary | ICD-10-CM

## 2013-12-14 ENCOUNTER — Telehealth: Payer: Self-pay | Admitting: *Deleted

## 2013-12-14 DIAGNOSIS — R768 Other specified abnormal immunological findings in serum: Secondary | ICD-10-CM

## 2013-12-14 NOTE — Telephone Encounter (Signed)
Message copied by Teddy Spike on Fri Dec 14, 2013 11:34 AM ------      Message from: Beatrice Lecher D      Created: Fri Dec 14, 2013  8:13 AM      Regarding: FW: Cancellation of Order # [id number]       Please call pt and let him know will have to go to lab to have drawn.  They didn't haven enough specimen to run it      ----- Message -----         From: [facility]: 12/14/2013   7:57 AM           To: Hali Marry, MD      Subject: Cancellation of Order # [id number]                         Order number [id number] for the procedure HCV RNA QUANT [[id number]]       has been canceled by [facility] [[email or web address]]. This       procedure was ordered by Hali Marry, MD [place] on Dec 13, 2013 for the patient Jared Harper [[id number]]. The       reason for cancellation was "Test request received without       appropriate specimen".       ------

## 2013-12-14 NOTE — Telephone Encounter (Signed)
Pt informed.Barkley, Tonya Lynetta  

## 2013-12-17 LAB — HEPATITIS C VRS RNA DETECT BY PCR-QUAL: HEPATITIS C VRS RNA BY PCR-QUAL: NEGATIVE

## 2013-12-19 ENCOUNTER — Telehealth: Payer: Self-pay | Admitting: *Deleted

## 2013-12-19 NOTE — Telephone Encounter (Signed)
Called patient again on 7/1 and left message to return call for home sleep test scheduling.

## 2013-12-20 ENCOUNTER — Ambulatory Visit (INDEPENDENT_AMBULATORY_CARE_PROVIDER_SITE_OTHER): Payer: BC Managed Care – PPO

## 2013-12-20 ENCOUNTER — Other Ambulatory Visit: Payer: BC Managed Care – PPO

## 2013-12-20 ENCOUNTER — Encounter: Payer: Self-pay | Admitting: Family Medicine

## 2013-12-20 DIAGNOSIS — N261 Atrophy of kidney (terminal): Secondary | ICD-10-CM | POA: Insufficient documentation

## 2013-12-20 DIAGNOSIS — R748 Abnormal levels of other serum enzymes: Secondary | ICD-10-CM

## 2013-12-20 DIAGNOSIS — K8020 Calculus of gallbladder without cholecystitis without obstruction: Secondary | ICD-10-CM

## 2013-12-20 DIAGNOSIS — K760 Fatty (change of) liver, not elsewhere classified: Secondary | ICD-10-CM | POA: Insufficient documentation

## 2013-12-24 ENCOUNTER — Other Ambulatory Visit: Payer: Self-pay | Admitting: *Deleted

## 2013-12-24 ENCOUNTER — Ambulatory Visit: Payer: BC Managed Care – PPO | Admitting: Family Medicine

## 2013-12-24 ENCOUNTER — Other Ambulatory Visit: Payer: Self-pay | Admitting: Family Medicine

## 2013-12-24 DIAGNOSIS — R748 Abnormal levels of other serum enzymes: Secondary | ICD-10-CM

## 2014-01-01 ENCOUNTER — Other Ambulatory Visit: Payer: Self-pay | Admitting: Family Medicine

## 2014-01-15 ENCOUNTER — Ambulatory Visit: Payer: BC Managed Care – PPO | Admitting: Sports Medicine

## 2014-01-19 ENCOUNTER — Other Ambulatory Visit: Payer: Self-pay | Admitting: Family Medicine

## 2014-04-15 ENCOUNTER — Other Ambulatory Visit: Payer: Self-pay | Admitting: Family Medicine

## 2014-05-17 ENCOUNTER — Other Ambulatory Visit: Payer: Self-pay | Admitting: Family Medicine

## 2014-06-19 ENCOUNTER — Other Ambulatory Visit: Payer: Self-pay | Admitting: Family Medicine

## 2014-06-26 ENCOUNTER — Ambulatory Visit (INDEPENDENT_AMBULATORY_CARE_PROVIDER_SITE_OTHER): Payer: BLUE CROSS/BLUE SHIELD | Admitting: *Deleted

## 2014-06-26 ENCOUNTER — Other Ambulatory Visit: Payer: Self-pay | Admitting: Family Medicine

## 2014-06-26 ENCOUNTER — Other Ambulatory Visit: Payer: Self-pay | Admitting: *Deleted

## 2014-06-26 ENCOUNTER — Encounter: Payer: Self-pay | Admitting: Family Medicine

## 2014-06-26 ENCOUNTER — Ambulatory Visit (INDEPENDENT_AMBULATORY_CARE_PROVIDER_SITE_OTHER): Payer: BLUE CROSS/BLUE SHIELD | Admitting: Family Medicine

## 2014-06-26 VITALS — BP 124/80 | HR 89 | Ht 69.0 in | Wt 292.0 lb

## 2014-06-26 DIAGNOSIS — M1A09X0 Idiopathic chronic gout, multiple sites, without tophus (tophi): Secondary | ICD-10-CM

## 2014-06-26 DIAGNOSIS — M722 Plantar fascial fibromatosis: Secondary | ICD-10-CM

## 2014-06-26 DIAGNOSIS — R7309 Other abnormal glucose: Secondary | ICD-10-CM | POA: Diagnosis not present

## 2014-06-26 DIAGNOSIS — R768 Other specified abnormal immunological findings in serum: Secondary | ICD-10-CM

## 2014-06-26 DIAGNOSIS — E118 Type 2 diabetes mellitus with unspecified complications: Secondary | ICD-10-CM | POA: Insufficient documentation

## 2014-06-26 DIAGNOSIS — M7741 Metatarsalgia, right foot: Secondary | ICD-10-CM

## 2014-06-26 DIAGNOSIS — I10 Essential (primary) hypertension: Secondary | ICD-10-CM | POA: Diagnosis not present

## 2014-06-26 DIAGNOSIS — Z23 Encounter for immunization: Secondary | ICD-10-CM

## 2014-06-26 DIAGNOSIS — M7742 Metatarsalgia, left foot: Secondary | ICD-10-CM

## 2014-06-26 DIAGNOSIS — R894 Abnormal immunological findings in specimens from other organs, systems and tissues: Secondary | ICD-10-CM

## 2014-06-26 DIAGNOSIS — R748 Abnormal levels of other serum enzymes: Secondary | ICD-10-CM

## 2014-06-26 DIAGNOSIS — N182 Chronic kidney disease, stage 2 (mild): Secondary | ICD-10-CM

## 2014-06-26 DIAGNOSIS — R7301 Impaired fasting glucose: Secondary | ICD-10-CM

## 2014-06-26 LAB — POCT UA - MICROALBUMIN
Albumin/Creatinine Ratio, Urine, POC: <30
Creatinine, POC: 200 mg/dL
Microalbumin Ur, POC: 30 mg/L

## 2014-06-26 LAB — BASIC METABOLIC PANEL WITH GFR
BUN: 14 mg/dL (ref 6–23)
CO2: 25 mEq/L (ref 19–32)
Calcium: 9.8 mg/dL (ref 8.4–10.5)
Chloride: 103 mEq/L (ref 96–112)
Creat: 1.25 mg/dL (ref 0.50–1.35)
GFR, Est African American: 78 mL/min
GFR, Est Non African American: 67 mL/min
GLUCOSE: 106 mg/dL — AB (ref 70–99)
Potassium: 4.5 mEq/L (ref 3.5–5.3)
Sodium: 138 mEq/L (ref 135–145)

## 2014-06-26 LAB — HEPATIC FUNCTION PANEL
ALK PHOS: 81 U/L (ref 39–117)
ALT: 69 U/L — ABNORMAL HIGH (ref 0–53)
AST: 35 U/L (ref 0–37)
Albumin: 4.7 g/dL (ref 3.5–5.2)
BILIRUBIN DIRECT: 0.2 mg/dL (ref 0.0–0.3)
Indirect Bilirubin: 0.6 mg/dL (ref 0.2–1.2)
Total Bilirubin: 0.8 mg/dL (ref 0.2–1.2)
Total Protein: 7.3 g/dL (ref 6.0–8.3)

## 2014-06-26 LAB — POCT GLYCOSYLATED HEMOGLOBIN (HGB A1C): Hemoglobin A1C: 6.2

## 2014-06-26 MED ORDER — CYCLOBENZAPRINE HCL 10 MG PO TABS: 10.0000 mg | ORAL_TABLET | Freq: Two times a day (BID) | ORAL | Status: DC | PRN

## 2014-06-26 MED ORDER — MELOXICAM 15 MG PO TABS: 15.0000 mg | ORAL_TABLET | Freq: Every day | ORAL | Status: DC

## 2014-06-26 MED ORDER — ATORVASTATIN CALCIUM 40 MG PO TABS: 40.0000 mg | ORAL_TABLET | Freq: Every day | ORAL | Status: DC

## 2014-06-26 MED ORDER — VALSARTAN 160 MG PO TABS: 160.0000 mg | ORAL_TABLET | Freq: Every day | ORAL | Status: DC

## 2014-06-26 NOTE — Progress Notes (Signed)
   Subjective:    Patient ID: Jared Harper, male    DOB: 06-14-1966, 49 y.o.   MRN: [id number]  HPI In early 2000 was told had gout.  Also given inserts for high arches.  Now when getting up his feet are really painful when he first gets up. Gets better after 1-2 minutes Feels like to whole bottom of the foot.  No icing or NSAIDs.  Wears socks that have a band around the arch for support.   Elevated liver enzymes-due to recheck. We checked his blood work in June and they were elevated. We added an acute hepatitis panel and he was positive for hepatitis C. But the RN a Quant was negative.  Impaired fasting glucose-no increased thirst or urination.  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  Hypertension- Pt denies chest pain, SOB, dizziness, or heart palpitations.  Taking meds as directed w/o problems.  Denies medication side effects.      Review of Systems     Objective:   Physical Exam  Constitutional: He is oriented to person, place, and time. He appears well-developed and well-nourished.  HENT:  Head: Normocephalic and atraumatic.  Cardiovascular: Normal rate, regular rhythm and normal heart sounds.   Pulmonary/Chest: Effort normal and breath sounds normal.  Musculoskeletal:  Dorsal pedal pulse 2+ bilaterally. Ankles with normal range of motion. Nontender over the heel or arch. Nontender over the metatarsal heads today. No swelling or inflammation over the joints.  Neurological: He is alert and oriented to person, place, and time.  Skin: Skin is warm and dry.  Psychiatric: He has a normal mood and affect. His behavior is normal.          Assessment & Plan:  Plantar fasciatis.- Discussed treatment with anti-inflammatory, icing massage, and stretches. Handout provided with additional information about the process as well as a handout for home stretches. If not improving then please let us know. This can take  several weeks even months to heal. I do think long-term he really needs to get custom orthotics especially for his work shoes. Encouraged him to schedule appointment with Dr. Dianah Field for those.  Metatarsalgia-suspect that the pain over the ball of the foot is metatarsalgia versus just radiation of pain from his plantar fascia. Gave him metatarsal pads to place in his shoes. Showed him how to put them into the shoe.  Gout-currently well controlled. He's on LP on all 2 mg daily and has not had any recent flares.  IFG - Check A1C today. A1C is 6.2.  Repeat in 6 months.   Elevated liver enzymes-due to repeat lab work today.  Chronic kidney disease stage II-due to repeat BUN creatinine as well as a urine microalbumin today.  HTN- well controlled.  Continue current regimen and follow up in 6 months.

## 2014-06-27 ENCOUNTER — Encounter: Payer: Self-pay | Admitting: Family Medicine

## 2014-06-27 LAB — HEPATITIS C VRS RNA DETECT BY PCR-QUAL: Hepatitis C Vrs RNA by PCR-Qual: NEGATIVE

## 2014-06-28 LAB — LIPID PANEL
CHOL/HDL RATIO: 5.8 ratio
Cholesterol: 180 mg/dL (ref 0–200)
HDL: 31 mg/dL — ABNORMAL LOW (ref >39)
LDL Cholesterol: 102 mg/dL — ABNORMAL HIGH (ref 0–99)
TRIGLYCERIDES: 237 mg/dL — AB (ref <150)
VLDL: 47 mg/dL — ABNORMAL HIGH (ref 0–40)

## 2014-06-28 LAB — URIC ACID: URIC ACID, SERUM: 6.8 mg/dL (ref 4.0–7.8)

## 2014-07-29 ENCOUNTER — Ambulatory Visit (INDEPENDENT_AMBULATORY_CARE_PROVIDER_SITE_OTHER): Payer: BLUE CROSS/BLUE SHIELD | Admitting: Sports Medicine

## 2014-07-29 ENCOUNTER — Encounter: Payer: Self-pay | Admitting: Sports Medicine

## 2014-07-29 VITALS — BP 132/92 | HR 87 | Wt 295.0 lb

## 2014-07-29 DIAGNOSIS — M216X9 Other acquired deformities of unspecified foot: Secondary | ICD-10-CM | POA: Diagnosis not present

## 2014-07-29 DIAGNOSIS — Q667 Congenital pes cavus, unspecified foot: Secondary | ICD-10-CM | POA: Insufficient documentation

## 2014-07-29 NOTE — Progress Notes (Signed)

## 2014-07-29 NOTE — Assessment & Plan Note (Signed)
With plantar fasciitis. Custom orthotics as above. Return to see me as needed.

## 2014-08-31 ENCOUNTER — Encounter: Payer: Self-pay | Admitting: Emergency Medicine

## 2014-08-31 ENCOUNTER — Emergency Department
Admission: EM | Admit: 2014-08-31 | Discharge: 2014-08-31 | Disposition: A | Discharge status: Home / Self Care | Payer: BLUE CROSS/BLUE SHIELD | Source: Home / Self Care | Attending: Family Medicine | Admitting: Family Medicine

## 2014-08-31 ENCOUNTER — Emergency Department (INDEPENDENT_AMBULATORY_CARE_PROVIDER_SITE_OTHER): Payer: BLUE CROSS/BLUE SHIELD

## 2014-08-31 DIAGNOSIS — M5417 Radiculopathy, lumbosacral region: Secondary | ICD-10-CM

## 2014-08-31 DIAGNOSIS — M5136 Other intervertebral disc degeneration, lumbar region: Secondary | ICD-10-CM

## 2014-08-31 DIAGNOSIS — M5416 Radiculopathy, lumbar region: Secondary | ICD-10-CM

## 2014-08-31 DIAGNOSIS — H9193 Unspecified hearing loss, bilateral: Secondary | ICD-10-CM

## 2014-08-31 HISTORY — DX: Dorsalgia, unspecified: M54.9

## 2014-08-31 MED ORDER — HYDROCODONE-ACETAMINOPHEN 5-325 MG PO TABS
ORAL_TABLET | ORAL | Status: DC
Start: 2014-08-31 — End: 2015-06-30

## 2014-08-31 MED ORDER — PREDNISONE 20 MG PO TABS: 20.0000 mg | ORAL_TABLET | Freq: Two times a day (BID) | ORAL | Status: DC

## 2014-08-31 MED ORDER — METAXALONE 800 MG PO TABS: 800.0000 mg | ORAL_TABLET | Freq: Three times a day (TID) | ORAL | Status: DC

## 2014-08-31 NOTE — ED Notes (Signed)
Reports twisting his back about 7 days ago; now has burning/needle sensation in left leg. Was seen for back disc problem about one year ago and this has similarities.

## 2014-08-31 NOTE — ED Provider Notes (Signed)
CSN: [id number]     Arrival date & time 08/31/14  1338 History   First MD Initiated Contact with Patient 08/31/14 1426     Chief Complaint  Patient presents with  . Leg Pain      HPI Comments: Patient presents with two problems: 1)  He reports twisting his back about one week ago with subsequent intermittent left lower back pain.  This morning he awoke with a "pins and needles" sensation in his left anterior leg.   He denies bowel or bladder dysfunction, and no saddle numbness.  2)  He complains of bilateral decreased hearing for about 6 months without ear pain or fullness  Patient is a 49 y.o. male presenting with back pain. The history is provided by the patient.  Back Pain Location:  Lumbar spine Quality:  Aching Radiates to:  L knee and L thigh Pain severity:  Mild Pain is:  Worse during the night Onset quality:  Gradual Duration:  1 week Timing:  Intermittent Progression:  Worsening Chronicity:  Recurrent Context comment:  Twisting back Relieved by:  Nothing Worsened by:  Movement Ineffective treatments:  Muscle relaxants Associated symptoms: paresthesias and tingling   Associated symptoms: no abdominal pain, no bladder incontinence, no bowel incontinence, no dysuria, no fever, no leg pain, no numbness, no pelvic pain, no perianal numbness, no weakness and no weight loss   Risk factors: obesity     Past Medical History  Diagnosis Date  . Hepatitis C antibody test positive     Though has had neg tests as well.   . Hypertension   . Hyperlipidemia   . Gout   . Kidney disease   . Back pain    Past Surgical History  Procedure Laterality Date  . Staph infection surgery x 3    . Carpal tunnel release  1979    left   . Thyroglossal duct cyst  1985  . Cyst removal on leg  1986  . Kidney stone surgery  2009    laser   Family History  Problem Relation Age of Onset  . Breast cancer      aunt  . Diabetes Brother   . Diabetes      grandfather  . Stroke      [place]     . Hyperlipidemia Maternal Grandfather   . Heart attack Maternal Grandfather    History  Substance Use Topics  . Smoking status: Never Smoker   . Smokeless tobacco: Current User     Comment: Snuff/dip  . Alcohol Use: No    Review of Systems  Constitutional: Negative for fever and weight loss.  HENT: Positive for hearing loss. Negative for ear discharge and ear pain.   Gastrointestinal: Negative for abdominal pain and bowel incontinence.  Genitourinary: Negative for bladder incontinence, dysuria and pelvic pain.  Musculoskeletal: Positive for back pain.  Neurological: Positive for tingling and paresthesias. Negative for weakness and numbness.  All other systems reviewed and are negative.   Allergies  Review of patient's allergies indicates no known allergies.  Home Medications   Prior to Admission medications   Medication Sig Start Date End Date Taking? Authorizing Provider  allopurinol (ZYLOPRIM) 100 MG tablet TAKE 2 TABLETS TWICE A DAY.    Hali Marry, MD  aspirin 81 MG tablet Take 81 mg by mouth daily.      Historical Provider, MD  atorvastatin (LIPITOR) 40 MG tablet Take 1 tablet (40 mg total) by mouth daily. 06/26/14   Rene Kocher  Metheney, MD  colchicine 0.6 MG tablet Take 1 tablet (0.6 mg total) by mouth daily. 10/30/12   Hali Marry, MD  HYDROcodone-acetaminophen (NORCO/VICODIN) 5-325 MG per tablet Take one by mouth at bedtime as needed for pain 08/31/14   Kandra Nicolas, MD  meloxicam (MOBIC) 15 MG tablet Take 1 tablet (15 mg total) by mouth daily. 06/26/14   Hali Marry, MD  metaxalone (SKELAXIN) 800 MG tablet Take 1 tablet (800 mg total) by mouth 3 (three) times daily. 08/31/14   Kandra Nicolas, MD  predniSONE (DELTASONE) 20 MG tablet Take 1 tablet (20 mg total) by mouth 2 (two) times daily. Take with food. 08/31/14   Kandra Nicolas, MD  sodium bicarbonate 650 MG tablet Take 1 tablet (650 mg total) by mouth 2 (two) times daily. 11/13/10   Hali Marry, MD  valsartan (DIOVAN) 160 MG tablet Take 1 tablet (160 mg total) by mouth daily. 06/26/14   Hali Marry, MD   BP 111/78 mmHg  Pulse 101  Temp(Src) 97.7 F (36.5 C) (Oral)  Resp 16  Ht 5\' 9"  (1.753 m)  Wt 290 lb (131.543 kg)  BMI 42.81 kg/m2  SpO2 95% Physical Exam  Constitutional: He appears well-developed and well-nourished. No distress.  Patient is obese (BMI 42.8)  HENT:  Right Ear: Tympanic membrane, external ear and ear canal normal.  Left Ear: External ear and ear canal normal.  Nose: Nose normal.  Mouth/Throat: Oropharynx is clear and moist.  Abdominal: There is no tenderness.  Musculoskeletal: He exhibits no edema.       Back:  Back:  Good range of motion.  Can heel/toe walk and squat without difficulty.  Tenderness in the midline   from approximately L2 to L3.  Straight leg raising test is negative.  Sitting knee extension test is negative.  Strength and sensation in the lower extremities is normal.  Patellar and achilles reflexes are normal   Lymphadenopathy:    He has no cervical adenopathy.  Neurological: He is alert. He has normal reflexes.  Skin: Skin is warm and dry. No rash noted.     Patient's area of paresthesia left anterior thigh marked in blue on diagram.  Nursing note and vitals reviewed.   ED Course  Procedures none   Tympanogram:  Normal both ears   Imaging Review Dg Lumbar Spine Complete  08/31/2014   CLINICAL DATA:  One week history of low back pain radiating into the left anterior thigh.  EXAM: LUMBAR SPINE - COMPLETE 4+ VIEW  COMPARISON:  None.  FINDINGS: Five non rib-bearing lumbar vertebrae with anatomic alignment. No fractures. Minimal to mild disc space narrowing at L3-4 and L4-5. Remaining disc spaces well preserved. No pars defects. Facet degenerative changes bilaterally at L5-S1. Visualized sacroiliac joints intact.  IMPRESSION: 1. No acute or subacute osseous abnormality. 2. Minimal to mild degenerative disc disease  at L3-4 and L4-5. Mild facet degenerative changes bilaterally at L5-S1.   Electronically Signed   By: Evangeline Dakin M.D.   On: 08/31/2014 16:04     MDM   1. Lumbar back pain with radiculopathy affecting left lower extremity   2. 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  Skelaxin 800mg  TID   Apply ice pack for 20 to 30 minutes, 3 to 4 times daily  Continue until pain decreases. Followup with Dr. Aundria Mems ([facility]) if not improving about 10 days.   Recommend follow-up with ENT physician  for further hearing evaluation.    Kandra Nicolas, MD 09/03/14 [id number]

## 2014-08-31 NOTE — Discharge Instructions (Signed)
Apply ice pack for 20 to 30 minutes, 3 to 4 times daily  Continue until pain decreases.   Recommend follow-up with ENT physician for further hearing evaluation.   Lumbosacral Radiculopathy Lumbosacral radiculopathy is a pinched nerve or nerves in the low back (lumbosacral area). When this happens you may have weakness in your legs and may not be able to stand on your toes. You may have pain going down into your legs. There may be difficulties with walking normally. There are many causes of this problem. Sometimes this may happen from an injury, or simply from arthritis or boney problems. It may also be caused by other illnesses such as diabetes. If there is no improvement after treatment, further studies may be done to find the exact cause. DIAGNOSIS  X-rays may be needed if the problems become long standing. Electromyograms may be done. This study is one in which the working of nerves and muscles is studied. HOME CARE INSTRUCTIONS   Applications of ice packs may be helpful. Ice can be used in a plastic bag with a towel around it to prevent frostbite to skin. This may be used every 2 hours for 20 to 30 minutes, or as needed, while awake, or as directed by your caregiver.  Only take over-the-counter or prescription medicines for pain, discomfort, or fever as directed by your caregiver.  If physical therapy was prescribed, follow your caregiver's directions. SEEK IMMEDIATE MEDICAL CARE IF:   You have pain not controlled with medications.  You seem to be getting worse rather than better.  You develop increasing weakness in your legs.  You develop loss of bowel or bladder control.  You have difficulty with walking or balance, or develop clumsiness in the use of your legs.  You have a fever. MAKE SURE YOU:   Understand these instructions.  Will watch your condition.  Will get help right away if you are not doing well or get worse. Document Released: 06/07/2005 Document Revised:  08/30/2011 Document Reviewed: 01/26/2008 [facility] Patient Information 2015 [place], [place]. This information is not intended to replace advice given to you by your health care provider. Make sure you discuss any questions you have with your health care provider.

## 2014-10-02 ENCOUNTER — Encounter: Payer: Self-pay | Admitting: Physician Assistant

## 2014-10-02 ENCOUNTER — Ambulatory Visit (INDEPENDENT_AMBULATORY_CARE_PROVIDER_SITE_OTHER): Payer: BLUE CROSS/BLUE SHIELD | Admitting: Physician Assistant

## 2014-10-02 VITALS — BP 127/92 | HR 106 | Wt 301.0 lb

## 2014-10-02 DIAGNOSIS — M216X9 Other acquired deformities of unspecified foot: Secondary | ICD-10-CM | POA: Diagnosis not present

## 2014-10-02 DIAGNOSIS — M722 Plantar fascial fibromatosis: Secondary | ICD-10-CM | POA: Diagnosis not present

## 2014-10-02 MED ORDER — COLCHICINE 0.6 MG PO TABS: 0.6000 mg | ORAL_TABLET | Freq: Every day | ORAL | Status: DC

## 2014-10-02 MED ORDER — ALLOPURINOL 100 MG PO TABS: ORAL_TABLET | ORAL | Status: DC

## 2014-10-02 MED ORDER — MELOXICAM 15 MG PO TABS: 15.0000 mg | ORAL_TABLET | Freq: Every day | ORAL | Status: DC

## 2014-10-02 NOTE — Progress Notes (Signed)
   Subjective:    Patient ID: Jared Harper, male    DOB: 1966-06-02, 49 y.o.   MRN: [id number]  HPI Pt presents to the clinic with bilateral plantar fascittis pain since January. He saw Dr. Madilyn Fireman. She gave him a course of prednisone, exercises, mobic and icing. He then saw Dr. Darene Lamer for orthotics. Nothing is helping. He continues to ice and take NSAID. Hx of gout but reports not gout pain. No pain at rest only when weight bearing. He feels like he cannot walk at all without being in pain. Pt has norco and mobic with little to no relief.    Review of Systems  All other systems reviewed and are negative.      Objective:   Physical Exam  Constitutional: He appears well-developed and well-nourished.  Musculoskeletal:  No redness or swelling of bilateral feet.  No tenderness over fascia bilaterally to palpation.  Extreme pain with becoming weight bearing.            Assessment & Plan:  Pes cavus/plantar fasciitis- instructed pt that he needs injection in feet. Pt scheduled for tomorrow with Dr. Darene Lamer for injections. Refilled mobic.   Gout- refilled colchine and alloupurinol for maintance no problems today.

## 2014-10-03 ENCOUNTER — Encounter: Payer: Self-pay | Admitting: Sports Medicine

## 2014-10-03 ENCOUNTER — Ambulatory Visit (INDEPENDENT_AMBULATORY_CARE_PROVIDER_SITE_OTHER): Payer: BLUE CROSS/BLUE SHIELD | Admitting: Sports Medicine

## 2014-10-03 VITALS — BP 125/90 | HR 100 | Wt 299.0 lb

## 2014-10-03 DIAGNOSIS — M722 Plantar fascial fibromatosis: Secondary | ICD-10-CM | POA: Diagnosis not present

## 2014-10-03 DIAGNOSIS — M1000 Idiopathic gout, unspecified site: Secondary | ICD-10-CM

## 2014-10-03 MED ORDER — COLCHICINE 0.6 MG PO CAPS: 1.0000 | ORAL_CAPSULE | Freq: Every day | ORAL | Status: DC

## 2014-10-03 NOTE — Assessment & Plan Note (Signed)
Persistent pain bilaterally. Injections as above. Formal physical therapy. Bilateral gel cups.

## 2014-10-03 NOTE — Progress Notes (Signed)
  Subjective:    CC: Foot pain  HPI: Plantar fasciitis: Bilateral, no improvement with orthotics. Has been doing some of the home exercises. Unfortunately pain is severe, persistent, and he desires interventional treatment.  Past medical history, Surgical history, Family history not pertinant except as noted below, Social history, Allergies, and medications have been entered into the medical record, reviewed, and no changes needed.   Review of Systems: No fevers, chills, night sweats, weight loss, chest pain, or shortness of breath.   Objective:    General: Well Developed, well nourished, and in no acute distress.  Neuro: Alert and oriented x3, extra-ocular muscles intact, sensation grossly intact.  HEENT: Normocephalic, atraumatic, pupils equal round reactive to light, neck supple, no masses, no lymphadenopathy, thyroid nonpalpable.  Skin: Warm and dry, no rashes. Cardiac: Regular rate and rhythm, no murmurs rubs or gallops, no lower extremity edema.  Respiratory: Clear to auscultation bilaterally. Not using accessory muscles, speaking in full sentences. Bilateral feet: No visible erythema or swelling. Range of motion is full in all directions. Strength is 5/5 in all directions. No hallux valgus. No pes cavus or pes planus. No abnormal callus noted. No pain over the navicular prominence, or base of fifth metatarsal. Tender to palpation of the calcaneal insertion of plantar fascia. No pain at the Achilles insertion. No pain over the calcaneal bursa. No pain of the retrocalcaneal bursa. No tenderness to palpation over the tarsals, metatarsals, or phalanges. No hallux rigidus or limitus. No tenderness palpation over interphalangeal joints. No pain with compression of the metatarsal heads. Neurovascularly intact distally.  Procedure: Real-time Ultrasound Guided Injection of right plantar fascia Device: GE Logiq E  Verbal informed consent obtained.  Time-out conducted.  Noted no  overlying erythema, induration, or other signs of local infection.  Skin prepped in a sterile fashion.  Local anesthesia: Topical Ethyl chloride.  With sterile technique and under real time ultrasound guidance: 25-gauge needle advanced just deep to the calcaneal insertion of the plantar fascia, 1 mL kenalog 40, 2 mL lidocaine injected easily.   Completed without difficulty  Pain immediately resolved suggesting accurate placement of the medication.  Advised to call if fevers/chills, erythema, induration, drainage, or persistent bleeding.  Images permanently stored and available for review in the ultrasound unit.  Impression: Technically successful ultrasound guided injection.  Procedure: Real-time Ultrasound Guided Injection of left plantar fascia Device: GE Logiq E  Verbal informed consent obtained.  Time-out conducted.  Noted no overlying erythema, induration, or other signs of local infection.  Skin prepped in a sterile fashion.  Local anesthesia: Topical Ethyl chloride.  With sterile technique and under real time ultrasound guidance: 25-gauge needle advanced just deep to the calcaneal insertion of the plantar fascia, 1 mL kenalog 40, 2 mL lidocaine injected easily.   Completed without difficulty  Pain immediately resolved suggesting accurate placement of the medication.  Advised to call if fevers/chills, erythema, induration, drainage, or persistent bleeding.  Images permanently stored and available for review in the ultrasound unit.  Impression: Technically successful ultrasound guided injection.  Impression and Recommendations:

## 2014-10-03 NOTE — Assessment & Plan Note (Signed)
Colchicine is expensive, switching to [place].

## 2014-10-31 ENCOUNTER — Ambulatory Visit: Payer: BLUE CROSS/BLUE SHIELD | Admitting: Sports Medicine

## 2014-12-25 ENCOUNTER — Other Ambulatory Visit: Payer: Self-pay | Admitting: Family Medicine

## 2014-12-25 ENCOUNTER — Ambulatory Visit: Payer: BLUE CROSS/BLUE SHIELD | Admitting: Family Medicine

## 2014-12-25 ENCOUNTER — Emergency Department
Admission: EM | Admit: 2014-12-25 | Discharge: 2014-12-25 | Disposition: A | Discharge status: Home / Self Care | Payer: BLUE CROSS/BLUE SHIELD | Source: Home / Self Care | Attending: Family Medicine | Admitting: Family Medicine

## 2014-12-25 ENCOUNTER — Encounter: Payer: Self-pay | Admitting: Emergency Medicine

## 2014-12-25 ENCOUNTER — Emergency Department (INDEPENDENT_AMBULATORY_CARE_PROVIDER_SITE_OTHER): Payer: BLUE CROSS/BLUE SHIELD

## 2014-12-25 DIAGNOSIS — R630 Anorexia: Secondary | ICD-10-CM

## 2014-12-25 DIAGNOSIS — M7731 Calcaneal spur, right foot: Secondary | ICD-10-CM | POA: Diagnosis not present

## 2014-12-25 DIAGNOSIS — M79671 Pain in right foot: Secondary | ICD-10-CM | POA: Diagnosis not present

## 2014-12-25 DIAGNOSIS — R7301 Impaired fasting glucose: Secondary | ICD-10-CM

## 2014-12-25 DIAGNOSIS — G4700 Insomnia, unspecified: Secondary | ICD-10-CM

## 2014-12-25 DIAGNOSIS — M722 Plantar fascial fibromatosis: Secondary | ICD-10-CM

## 2014-12-25 DIAGNOSIS — N179 Acute kidney failure, unspecified: Secondary | ICD-10-CM

## 2014-12-25 DIAGNOSIS — I10 Essential (primary) hypertension: Secondary | ICD-10-CM

## 2014-12-25 DIAGNOSIS — N261 Atrophy of kidney (terminal): Secondary | ICD-10-CM

## 2014-12-25 LAB — COMPLETE METABOLIC PANEL WITH GFR
ALBUMIN: 4.6 g/dL (ref 3.5–5.2)
ALK PHOS: 78 U/L (ref 39–117)
ALT: 66 U/L — AB (ref 0–53)
AST: 30 U/L (ref 0–37)
BUN: 12 mg/dL (ref 6–23)
CO2: 27 mEq/L (ref 19–32)
Calcium: 9.7 mg/dL (ref 8.4–10.5)
Chloride: 106 mEq/L (ref 96–112)
Creat: 1.42 mg/dL — ABNORMAL HIGH (ref 0.50–1.35)
GFR, EST AFRICAN AMERICAN: 66 mL/min
GFR, EST NON AFRICAN AMERICAN: 57 mL/min — AB
Glucose, Bld: 97 mg/dL (ref 70–99)
POTASSIUM: 4.4 meq/L (ref 3.5–5.3)
Sodium: 145 mEq/L (ref 135–145)
Total Bilirubin: 1 mg/dL (ref 0.2–1.2)
Total Protein: 7.1 g/dL (ref 6.0–8.3)

## 2014-12-25 LAB — HEMOGLOBIN A1C
Hgb A1c MFr Bld: 5.8 % — ABNORMAL HIGH (ref <5.7)
MEAN PLASMA GLUCOSE: 120 mg/dL — AB (ref <117)

## 2014-12-25 NOTE — ED Notes (Addendum)
Right heel pain x 3 days, hx Plantar Fasciitis

## 2014-12-25 NOTE — ED Provider Notes (Signed)
CSN: [id number]     Arrival date & time 12/25/14  0841 History   First MD Initiated Contact with Patient 12/25/14 [phone]     Chief Complaint  Patient presents with  . Foot Pain   (Consider location/radiation/quality/duration/timing/severity/associated sxs/prior Treatment) HPI Pt is a 49yo male with hx of plantar fascitis, gout, HTN, hyperlipidemia, Hep C, and kidney disease, presenting to [facility] with c/o gradually worsening, constant Right foot pain that started 3 days ago after he was lifting a large tire.  Pain is aching, sore, occasionally sharp, moderate in severity, worse with ambulation and palpation.  Pain is worse on the plantar lateral aspect of his foot. States it feels different than his plantar fascitis as that is normally heel pain. He has tried meloxicam with minimal relief.   Pt also c/o insomnia and decreased appetite for last 1-2 weeks. Reports increased stress with his mother being in the hospital recently for SBO but states she has been in the hospital before and he has not had this issue. Pt also reports watery diarrhea after he eats. States he can only eat about 1 meal a day due to decreased appetite. Denies fever, chills, n/v/d. Denies recent travel or sick contacts. Pt states he hates taking medications and does not want a sleeping pill but wants to know why he cannot sleep.   Past Medical History  Diagnosis Date  . Hepatitis C antibody test positive     Though has had neg tests as well.   . Hypertension   . Hyperlipidemia   . Gout   . Kidney disease   . Back pain    Past Surgical History  Procedure Laterality Date  . Staph infection surgery x 3    . Carpal tunnel release  1979    left   . Thyroglossal duct cyst  1985  . Cyst removal on leg  1986  . Kidney stone surgery  2009    laser   Family History  Problem Relation Age of Onset  . Breast cancer      aunt  . Diabetes Brother   . Diabetes      grandfather  . Stroke      [place]   . Hyperlipidemia Maternal  Grandfather   . Heart attack Maternal Grandfather    History  Substance Use Topics  . Smoking status: Never Smoker   . Smokeless tobacco: Current User     Comment: Snuff/dip  . Alcohol Use: No    Review of Systems  Constitutional: Positive for appetite change. Negative for fever, chills, diaphoresis, fatigue and unexpected weight change.  HENT: Negative for congestion, ear pain and sore throat.   Respiratory: Negative for cough and shortness of breath.   Cardiovascular: Negative for chest pain, palpitations and leg swelling.  Gastrointestinal: Positive for diarrhea. Negative for nausea, vomiting and abdominal pain.  Genitourinary: Negative for dysuria, hematuria and flank pain.  Musculoskeletal: Positive for myalgias and arthralgias. Negative for back pain.       Right foot  Skin: Negative for color change, rash and wound.  Neurological: Positive for headaches. Negative for dizziness, weakness, light-headedness and numbness.    Allergies  Review of patient's allergies indicates no known allergies.  Home Medications   Prior to Admission medications   Medication Sig Start Date End Date Taking? Authorizing Provider  allopurinol (ZYLOPRIM) 100 MG tablet TAKE 2 TABLETS TWICE A DAY. 10/02/14   Jade L Breeback, PA-C  aspirin 81 MG tablet Take 81 mg by mouth daily.  Historical Provider, MD  atorvastatin (LIPITOR) 40 MG tablet Take 1 tablet (40 mg total) by mouth daily. 06/26/14   Hali Marry, MD  Colchicine (MITIGARE) 0.6 MG CAPS Take 1 capsule by mouth daily. 10/03/14   Silverio Decamp, MD  HYDROcodone-acetaminophen (NORCO/VICODIN) 5-325 MG per tablet Take one by mouth at bedtime as needed for pain 08/31/14   Kandra Nicolas, MD  meloxicam (MOBIC) 15 MG tablet Take 1 tablet (15 mg total) by mouth daily. 10/02/14   Jade L Breeback, PA-C  metaxalone (SKELAXIN) 800 MG tablet Take 1 tablet (800 mg total) by mouth 3 (three) times daily. 08/31/14   Kandra Nicolas, MD  sodium  bicarbonate 650 MG tablet Take 1 tablet (650 mg total) by mouth 2 (two) times daily. 11/13/10   Hali Marry, MD  valsartan (DIOVAN) 160 MG tablet Take 1 tablet (160 mg total) by mouth daily. 06/26/14   Hali Marry, MD   BP 120/88 mmHg  Pulse 75  Temp(Src) 98.1 F (36.7 C) (Oral)  Ht 5\' 9"  (1.753 m)  Wt 283 lb (128.368 kg)  BMI 41.77 kg/m2  SpO2 97% Physical Exam  Constitutional: He appears well-developed and well-nourished.  HENT:  Head: Normocephalic and atraumatic.  Eyes: Conjunctivae are normal. No scleral icterus.  Neck: Normal range of motion. Neck supple.  Cardiovascular: Normal rate, regular rhythm and normal heart sounds.   Pulses:      Dorsalis pedis pulses are 2+ on the right side.  Right foot: cap refill <3 seconds  Pulmonary/Chest: Effort normal and breath sounds normal. No respiratory distress. He has no wheezes. He has no rales. He exhibits no tenderness.  Abdominal: Soft. Bowel sounds are normal. He exhibits no distension and no mass. There is no tenderness. There is no rebound and no guarding.  Musculoskeletal: Normal range of motion. He exhibits tenderness. He exhibits no edema.  Neurological: He is alert.  Right foot: sensation normal to light and sharp touch.  Skin: Skin is warm and dry.  Right foot: Skin in tact. No ecchymosis, erythema, or warmth. No red streaking, induration, or evidence of underlying infection.  Nursing note and vitals reviewed.   ED Course  Procedures (including critical care time) Labs Review Labs Reviewed - No data to display  Imaging Review Dg Foot Complete Right  12/25/2014   CLINICAL DATA:  Right foot pain after lifting a large tire 2 days ago. Pain is posterior near the heel. Initial encounter.  EXAM: RIGHT FOOT COMPLETE - 3+ VIEW  COMPARISON:  None.  FINDINGS: No acute fracture or dislocation is identified. Posterior and plantar calcaneal enthesophytes are noted. A small well corticated ossicle projects just distal to  the lateral malleolus, possibly remote trauma. No lytic or blastic osseous lesion or soft tissue abnormality is seen.  IMPRESSION: Calcaneal enthesophytes.  No acute osseous abnormality identified.   Electronically Signed   By: Logan Bores   On: 12/25/2014 09:28     MDM   1. Right foot pain   2. Insomnia   3. Decreased appetite   4. Plantar fasciitis of right foot    Patient is a 49 year old male complaining of exacerbation of right foot pain.  On exam tenderness to plantar lateral aspect of foot. Plain films performed due to recent strenuous activity concern for possible stress fracture. Plain films show no evidence of acute injury. Foot is neurovascularly intact without evidence of underlying infection. Pain likely due to exacerbation of plantar fasciitis. Will treat symptomatically. Encouraged  to use meloxicam as well as frozen water bottle for home exercises. Follow-up with Dr. Dianah Field for further evaluation and treatment of plantar fasciitis.  Patient also complaining of vague symptoms of insomnia and decreased appetite for the last 1-2 weeks. Patient appears well nontoxic. No evidence of emergent process taking place at this time. Patient does have a history of hep C and kidney disease. Patient had blood drawn prior to arrival to urgent care for routine fasting blood draw.  Encourage patient to follow-up with his PCP to discuss these results in complaints of insomnia and decreased appetite.   Return precautions provided. Pt verbalized understanding and agreement with tx plan.   Noland Fordyce, PA-C 12/25/14 1024

## 2014-12-25 NOTE — Discharge Instructions (Signed)
Please take your meloxicam as prescribed.  You may also freeze a water bottle then roll it under your foot 2-3 times a day to help with pain.  See below for further instructions.

## 2014-12-26 ENCOUNTER — Other Ambulatory Visit: Payer: Self-pay | Admitting: *Deleted

## 2014-12-26 DIAGNOSIS — R748 Abnormal levels of other serum enzymes: Secondary | ICD-10-CM

## 2014-12-30 ENCOUNTER — Other Ambulatory Visit: Payer: Self-pay | Admitting: *Deleted

## 2014-12-30 DIAGNOSIS — R7989 Other specified abnormal findings of blood chemistry: Secondary | ICD-10-CM

## 2014-12-30 DIAGNOSIS — R945 Abnormal results of liver function studies: Principal | ICD-10-CM

## 2014-12-30 NOTE — Addendum Note (Signed)
Addended by: Beatrice Lecher D on: 12/30/2014 09:51 AM   Modules accepted: Orders, Level of Service

## 2015-01-07 ENCOUNTER — Ambulatory Visit (INDEPENDENT_AMBULATORY_CARE_PROVIDER_SITE_OTHER): Payer: BLUE CROSS/BLUE SHIELD

## 2015-01-07 ENCOUNTER — Other Ambulatory Visit: Payer: Self-pay | Admitting: *Deleted

## 2015-01-07 ENCOUNTER — Encounter: Payer: Self-pay | Admitting: Family Medicine

## 2015-01-07 ENCOUNTER — Ambulatory Visit (INDEPENDENT_AMBULATORY_CARE_PROVIDER_SITE_OTHER): Payer: BLUE CROSS/BLUE SHIELD | Admitting: Family Medicine

## 2015-01-07 VITALS — BP 130/89 | HR 111 | Wt 280.0 lb

## 2015-01-07 DIAGNOSIS — M722 Plantar fascial fibromatosis: Secondary | ICD-10-CM

## 2015-01-07 DIAGNOSIS — R197 Diarrhea, unspecified: Secondary | ICD-10-CM

## 2015-01-07 DIAGNOSIS — K8020 Calculus of gallbladder without cholecystitis without obstruction: Secondary | ICD-10-CM | POA: Diagnosis not present

## 2015-01-07 DIAGNOSIS — N179 Acute kidney failure, unspecified: Secondary | ICD-10-CM | POA: Diagnosis not present

## 2015-01-07 DIAGNOSIS — N261 Atrophy of kidney (terminal): Secondary | ICD-10-CM | POA: Diagnosis not present

## 2015-01-07 DIAGNOSIS — F5000 Anorexia nervosa, unspecified: Secondary | ICD-10-CM

## 2015-01-07 NOTE — Progress Notes (Signed)
Jared Harper is a 49 y.o. male who presents to [facility]  today for plantar fasciitis. Patient has a history of bilateral plantar fasciitis. This is managed with injection, heel pads, and limited home exercise program. He has not doing calf eccentric exercises nor dedicated ice massage. His symptoms are intermittent at times. They are persistently however. Pain is moderate to severe at times. No radiating pain weakness or numbness fevers or chills. His pain currently is mild.   Past Medical History  Diagnosis Date  . Hepatitis C antibody test positive     Though has had neg tests as well.   . Hypertension   . Hyperlipidemia   . Gout   . Kidney disease   . Back pain    Past Surgical History  Procedure Laterality Date  . Staph infection surgery x 3    . Carpal tunnel release  1979    left   . Thyroglossal duct cyst  1985  . Cyst removal on leg  1986  . Kidney stone surgery  2009    laser   History  Substance Use Topics  . Smoking status: Never Smoker   . Smokeless tobacco: Current User     Comment: Snuff/dip  . Alcohol Use: No   ROS as above Medications: Current Outpatient Prescriptions  Medication Sig Dispense Refill  . allopurinol (ZYLOPRIM) 100 MG tablet TAKE 2 TABLETS TWICE A DAY. 360 tablet 1  . aspirin 81 MG tablet Take 81 mg by mouth daily.      Marland Kitchen atorvastatin (LIPITOR) 40 MG tablet Take 1 tablet (40 mg total) by mouth daily. 90 tablet 1  . Colchicine (MITIGARE) 0.6 MG CAPS Take 1 capsule by mouth daily. 30 capsule 11  . HYDROcodone-acetaminophen (NORCO/VICODIN) 5-325 MG per tablet Take one by mouth at bedtime as needed for pain 10 tablet 0  . meloxicam (MOBIC) 15 MG tablet Take 1 tablet (15 mg total) by mouth daily. 30 tablet 1  . metaxalone (SKELAXIN) 800 MG tablet Take 1 tablet (800 mg total) by mouth 3 (three) times daily. 20 tablet 0  . sodium bicarbonate 650 MG tablet Take 1 tablet (650 mg total) by mouth 2 (two) times  daily. 60 tablet 6  . valsartan (DIOVAN) 160 MG tablet Take 1 tablet (160 mg total) by mouth daily. 90 tablet 1   No current facility-administered medications for this visit.   No Known Allergies   Exam:  BP 130/89 mmHg  Pulse 111  Wt 280 lb (127.007 kg) Gen: Well NAD HEENT: EOMI,  MMM Lungs: Normal work of breathing. CTABL Heart: RRR no MRG Abd: NABS, Soft. Nondistended, Nontender Exts: Brisk capillary refill, warm and well perfused.  Feet right foot mild collapse of longitudinal arch. Nontender. Normal motion pulses capillary refill  No results found for this or any previous visit (from the past 24 hour(s)). US Abdomen Complete  01/07/2015   CLINICAL DATA:  Elevated liver enzymes and cm creatinine. History of hepatitis-C  EXAM: ULTRASOUND ABDOMEN COMPLETE  COMPARISON:  Abdominal ultrasound December 20, 2013  FINDINGS: Gallbladder: Within the gallbladder, there are multiple echogenic foci which move and shadow consistent with gallstones. Largest gallstone measures 9 mm in length. There is no gallbladder wall thickening or pericholecystic fluid. No sonographic Murphy sign noted.  Common bile duct: Diameter: 3 mm. There is no intrahepatic, common hepatic, or common bile duct dilatation.  Liver: No focal lesion identified. Liver echogenicity is diffusely increased.  IVC: No abnormality visualized. Portions  of the inferior vena cava are obscured by gas.  Pancreas: Pancreas is essentially completely obscured by gas.  Spleen: Size and appearance within normal limits.  Right Kidney: Length: 12.3 cm. Echogenicity and renal cortical thickness are within normal limits. No mass or hydronephrosis visualized.  Left Kidney: Length: 9.8 cm. Echogenicity is mildly increased. There is renal cortical thinning diffusely. No mass or hydronephrosis visualized.  Abdominal aorta: No aneurysm visualized.  Other findings: No demonstrable ascites.  IMPRESSION: Cholelithiasis.  Diffuse increase in liver echogenicity, a  finding most likely due to hepatic steatosis. Underlying parenchymal liver disease may also be contributory. While no focal liver lesions are identified, it must be cautioned that the sensitivity of ultrasound for focal liver lesions is diminished significantly in this circumstance.  Evidence of a degree of atrophy in the left kidney. This finding was noted previously. Right kidney appears unremarkable.  Much of the inferior vena cava and essentially all of the pancreas are obscured by gas.   Electronically Signed   By: Lowella Grip III M.D.   On: 01/07/2015 10:57     Please see individual assessment and plan sections.

## 2015-01-07 NOTE — Patient Instructions (Signed)
Thank you for coming in today. Do the heel exercises 30 reps 3 sets daily.  Do the ice massage for 20 mins daily.  Return to Dr. Darene Lamer or myself.   Consider Arch Straps.   Plantar Fasciitis Plantar fasciitis is a common condition that causes foot pain. It is soreness (inflammation) of the band of tough fibrous tissue on the bottom of the foot that runs from the heel bone (calcaneus) to the ball of the foot. The cause of this soreness may be from excessive standing, poor fitting shoes, running on hard surfaces, being overweight, having an abnormal walk, or overuse (this is common in runners) of the painful foot or feet. It is also common in aerobic exercise dancers and ballet dancers. SYMPTOMS  Most people with plantar fasciitis complain of:  Severe pain in the morning on the bottom of their foot especially when taking the first steps out of bed. This pain recedes after a few minutes of walking.  Severe pain is experienced also during walking following a long period of inactivity.  Pain is worse when walking barefoot or up stairs DIAGNOSIS   Your caregiver will diagnose this condition by examining and feeling your foot.  Special tests such as X-rays of your foot, are usually not needed. PREVENTION   Consult a sports medicine professional before beginning a new exercise program.  Walking programs offer a good workout. With walking there is a lower chance of overuse injuries common to runners. There is less impact and less jarring of the joints.  Begin all new exercise programs slowly. If problems or pain develop, decrease the amount of time or distance until you are at a comfortable level.  Wear good shoes and replace them regularly.  Stretch your foot and the heel cords at the back of the ankle (Achilles tendon) both before and after exercise.  Run or exercise on even surfaces that are not hard. For example, asphalt is better than pavement.  Do not run barefoot on hard surfaces.  If  using a treadmill, vary the incline.  Do not continue to workout if you have foot or joint problems. Seek professional help if they do not improve. HOME CARE INSTRUCTIONS   Avoid activities that cause you pain until you recover.  Use ice or cold packs on the problem or painful areas after working out.  Only take over-the-counter or prescription medicines for pain, discomfort, or fever as directed by your caregiver.  Soft shoe inserts or athletic shoes with air or gel sole cushions may be helpful.  If problems continue or become more severe, consult a sports medicine caregiver or your own health care provider. Cortisone is a potent anti-inflammatory medication that may be injected into the painful area. You can discuss this treatment with your caregiver. MAKE SURE YOU:   Understand these instructions.  Will watch your condition.  Will get help right away if you are not doing well or get worse. Document Released: 03/02/2001 Document Revised: 08/30/2011 Document Reviewed: 05/01/2008 [facility] Patient Information 2015 [place], [place]. This information is not intended to replace advice given to you by your health care provider. Make sure you discuss any questions you have with your health care provider.

## 2015-01-07 NOTE — Assessment & Plan Note (Addendum)
Add eccentric heel exercises, dedicated ice massage, and arch straps. Follow-up as needed.

## 2015-01-17 ENCOUNTER — Telehealth: Payer: Self-pay

## 2015-01-17 DIAGNOSIS — Z1211 Encounter for screening for malignant neoplasm of colon: Secondary | ICD-10-CM

## 2015-01-17 NOTE — Telephone Encounter (Signed)
Pt called stating that he has not heard anything about his GI referral. I advised that he give [place] a call back If he has not heard anything by Monday afternoon. Pt also wanted to know that is he wants to have his colonoscopy done if a separate referral would need to be sent for insurance purposes. Please advise.

## 2015-01-17 NOTE — Telephone Encounter (Signed)
Please call patient back and let him know that it looks like they attempted to leave him a message on the 20th but it said that his mailbox was full. You may want to give him their direct numbers of the he can call to schedule.

## 2015-01-20 NOTE — Telephone Encounter (Signed)
Called pt and advised him to call [place] [facility] for appointment. I also orderd a separate referral for the pt to have a colonoscopy.

## 2015-01-21 ENCOUNTER — Encounter: Payer: Self-pay | Admitting: Gastroenterology

## 2015-01-23 ENCOUNTER — Ambulatory Visit (INDEPENDENT_AMBULATORY_CARE_PROVIDER_SITE_OTHER): Payer: BLUE CROSS/BLUE SHIELD | Admitting: Family Medicine

## 2015-01-23 ENCOUNTER — Encounter: Payer: Self-pay | Admitting: Family Medicine

## 2015-01-23 VITALS — BP 127/87 | HR 60 | Wt 281.0 lb

## 2015-01-23 DIAGNOSIS — R7301 Impaired fasting glucose: Secondary | ICD-10-CM

## 2015-01-23 DIAGNOSIS — F5000 Anorexia nervosa, unspecified: Secondary | ICD-10-CM | POA: Diagnosis not present

## 2015-01-23 DIAGNOSIS — I10 Essential (primary) hypertension: Secondary | ICD-10-CM | POA: Diagnosis not present

## 2015-01-23 DIAGNOSIS — R634 Abnormal weight loss: Secondary | ICD-10-CM

## 2015-01-23 NOTE — Progress Notes (Signed)
Subjective:    Patient ID: Jared Harper, male    DOB: 01/31/1966, 49 y.o.   MRN: [id number]  HPI Hypertension- Pt denies chest pain, SOB, dizziness, or heart palpitations.  Taking meds as directed w/o problems.  Denies medication side effects.    Colonoscopy set for October 25th.  No blood int the stool. Diarrhea seems to have resolved on its on. No abdominal pain.  Anorexia - says very dec appetite.  Not sleeping well. No abdominal pain. Did get a call from GI. Hasn't schedule and OV.  No nausea or abdominal pain.  No nausea, vomiting.  No early satiety.  No abdominal pain.  Stools have been normal.  He says if he skips breakfast and he'll be very hungry by lunch time. But if he eats breakfast and he doesn't want to eat the rest of the day and this is very unusual for him. He denies any excess stressors and says in fact his stress levels have actually been better as his mother's health has improved recently. He also chews tobacco and says normally this increases when he is under more stress or increased anxiety levels and he has not been doing that. He has been using tobacco products since he was 49 years old. Primarily chewing tobacco.  Review of Systems  BP 127/87 mmHg  Pulse 60  Wt 281 lb (127.461 kg)    No Known Allergies  Past Medical History  Diagnosis Date  . Hepatitis C antibody test positive     Though has had neg tests as well.   . Hypertension   . Hyperlipidemia   . Gout   . Kidney disease   . Back pain     Past Surgical History  Procedure Laterality Date  . Staph infection surgery x 3    . Carpal tunnel release  1979    left   . Thyroglossal duct cyst  1985  . Cyst removal on leg  1986  . Kidney stone surgery  2009    laser    History   Social History  . Marital Status: Single    Spouse Name: N/A  . Number of Children: N/A  . Years of Education: some colle   Occupational History  . [employer]Perations Manger      [employer]   Social History  Main Topics  . Smoking status: Never Smoker   . Smokeless tobacco: Current User     Comment: Snuff/dip  . Alcohol Use: No  . Drug Use: No  . Sexual Activity: Not on file   Other Topics Concern  . Not on file   Social History Narrative   No regular exercise.    Family History  Problem Relation Age of Onset  . Breast cancer      aunt  . Diabetes Brother   . Diabetes      grandfather  . Stroke      uncel   . Hyperlipidemia Maternal Grandfather   . Heart attack Maternal Grandfather     Outpatient Encounter Prescriptions as of 01/23/2015  Medication Sig  . allopurinol (ZYLOPRIM) 100 MG tablet TAKE 2 TABLETS TWICE A DAY.  Marland Kitchen aspirin 81 MG tablet Take 81 mg by mouth daily.    Marland Kitchen atorvastatin (LIPITOR) 40 MG tablet Take 1 tablet (40 mg total) by mouth daily.  . Colchicine (MITIGARE) 0.6 MG CAPS Take 1 capsule by mouth daily.  Marland Kitchen HYDROcodone-acetaminophen (NORCO/VICODIN) 5-325 MG per tablet Take one by mouth at bedtime as needed for  pain  . metaxalone (SKELAXIN) 800 MG tablet Take 1 tablet (800 mg total) by mouth 3 (three) times daily.  . sodium bicarbonate 650 MG tablet Take 1 tablet (650 mg total) by mouth 2 (two) times daily.  . valsartan (DIOVAN) 160 MG tablet Take 1 tablet (160 mg total) by mouth daily.  . [DISCONTINUED] meloxicam (MOBIC) 15 MG tablet Take 1 tablet (15 mg total) by mouth daily.   No facility-administered encounter medications on file as of 01/23/2015.          Objective:   Physical Exam  Constitutional: He is oriented to person, place, and time. He appears well-developed and well-nourished.  HENT:  Head: Normocephalic and atraumatic.  Right Ear: External ear normal.  Left Ear: External ear normal.  Nose: Nose normal.  Mouth/Throat: Oropharynx is clear and moist.  TMs and canals are clear.   Eyes: Conjunctivae and EOM are normal. Pupils are equal, round, and reactive to light.  Neck: Neck supple. No thyromegaly present.  Cardiovascular: Normal rate,  regular rhythm and normal heart sounds.   Pulmonary/Chest: Effort normal and breath sounds normal.  Abdominal: Soft. Bowel sounds are normal. He exhibits no distension and no mass. There is no tenderness. There is no rebound and no guarding.  Lymphadenopathy:    He has no cervical adenopathy.  Neurological: He is alert and oriented to person, place, and time.  Skin: Skin is warm and dry.  Psychiatric: He has a normal mood and affect. His behavior is normal.          Assessment & Plan:  HTN - well controlled on the medication.  Continue current regimen. Follow-up in about 5-6 months.  IFG - last A1c actually looks much better. Last A1c was 5.8. Continue work on diet and exercise and follow-up in about 5 months. Next  Anorexia-unclear etiology. His weight has actually been stable over the last month and he has not continued to lose weight which to me is very reassuring. Certainly with his long-standing tobacco use it may be worth seeing GI to make sure that they don't feel like he might need an endoscopy that he is not experiencing any abdominal pain, GI upset, reflux, or dysphagia. He has not had any respiratory or pulmonary symptoms. But could consider a chest x-ray that he has not really been a smoker. I did recommend we do some additional blood work today including a blood count and thyroid level and recheck his kidney function which was mildly elevated a month ago on bloodwork.

## 2015-01-25 LAB — CBC WITH DIFFERENTIAL/PLATELET
BASOS ABS: 0 10*3/uL (ref 0.0–0.1)
Basophils Relative: 0 % (ref 0–1)
EOS PCT: 3 % (ref 0–5)
Eosinophils Absolute: 0.2 10*3/uL (ref 0.0–0.7)
HEMATOCRIT: 42.9 % (ref 39.0–52.0)
Hemoglobin: 15.1 g/dL (ref 13.0–17.0)
LYMPHS PCT: 34 % (ref 12–46)
Lymphs Abs: 2.3 10*3/uL (ref 0.7–4.0)
MCH: 31.3 pg (ref 26.0–34.0)
MCHC: 35.2 g/dL (ref 30.0–36.0)
MCV: 89 fL (ref 78.0–100.0)
MONO ABS: 0.5 10*3/uL (ref 0.1–1.0)
MPV: 11.2 fL (ref 8.6–12.4)
Monocytes Relative: 7 % (ref 3–12)
NEUTROS ABS: 3.8 10*3/uL (ref 1.7–7.7)
Neutrophils Relative %: 56 % (ref 43–77)
Platelets: 247 10*3/uL (ref 150–400)
RBC: 4.82 MIL/uL (ref 4.22–5.81)
RDW: 13.9 % (ref 11.5–15.5)
WBC: 6.7 10*3/uL (ref 4.0–10.5)

## 2015-01-25 LAB — BASIC METABOLIC PANEL WITH GFR
BUN: 14 mg/dL (ref 7–25)
CALCIUM: 9.5 mg/dL (ref 8.6–10.3)
CO2: 25 mmol/L (ref 20–31)
Chloride: 103 mmol/L (ref 98–110)
Creat: 1.45 mg/dL — ABNORMAL HIGH (ref 0.70–1.33)
GFR, Est African American: 64 mL/min (ref >=60)
GFR, Est Non African American: 56 mL/min — ABNORMAL LOW (ref >=60)
GLUCOSE: 115 mg/dL — AB (ref 65–99)
Potassium: 4.1 mmol/L (ref 3.5–5.3)
Sodium: 142 mmol/L (ref 135–146)

## 2015-01-25 LAB — TSH: TSH: 0.949 u[IU]/mL (ref 0.350–4.500)

## 2015-01-25 LAB — PSA: PSA: 0.36 ng/mL (ref <=4.00)

## 2015-02-13 ENCOUNTER — Ambulatory Visit (INDEPENDENT_AMBULATORY_CARE_PROVIDER_SITE_OTHER): Payer: BLUE CROSS/BLUE SHIELD | Admitting: Family Medicine

## 2015-02-13 ENCOUNTER — Encounter: Payer: Self-pay | Admitting: Family Medicine

## 2015-02-13 VITALS — BP 130/92 | HR 97 | Wt 282.0 lb

## 2015-02-13 DIAGNOSIS — M109 Gout, unspecified: Secondary | ICD-10-CM | POA: Insufficient documentation

## 2015-02-13 DIAGNOSIS — M10062 Idiopathic gout, left knee: Secondary | ICD-10-CM | POA: Diagnosis not present

## 2015-02-13 NOTE — Patient Instructions (Signed)
Thank you for coming in today. Call or go to the ER if you develop a large red swollen joint with extreme pain or oozing puss.  Take colchicine daily for 2 months. Restart allopurinol. Return if not better.  Gout Gout is an inflammatory arthritis caused by a buildup of uric acid crystals in the joints. Uric acid is a chemical that is normally present in the blood. When the level of uric acid in the blood is too high it can form crystals that deposit in your joints and tissues. This causes joint redness, soreness, and swelling (inflammation). Repeat attacks are common. Over time, uric acid crystals can form into masses (tophi) near a joint, destroying bone and causing disfigurement. Gout is treatable and often preventable. CAUSES  The disease begins with elevated levels of uric acid in the blood. Uric acid is produced by your body when it breaks down a naturally found substance called purines. Certain foods you eat, such as meats and fish, contain high amounts of purines. Causes of an elevated uric acid level include:  Being passed down from parent to child (heredity).  Diseases that cause increased uric acid production (such as obesity, psoriasis, and certain cancers).  Excessive alcohol use.  Diet, especially diets rich in meat and seafood.  Medicines, including certain cancer-fighting medicines (chemotherapy), water pills (diuretics), and aspirin.  Chronic kidney disease. The kidneys are no longer able to remove uric acid well.  Problems with metabolism. Conditions strongly associated with gout include:  Obesity.  High blood pressure.  High cholesterol.  Diabetes. Not everyone with elevated uric acid levels gets gout. It is not understood why some people get gout and others do not. Surgery, joint injury, and eating too much of certain foods are some of the factors that can lead to gout attacks. SYMPTOMS   An attack of gout comes on quickly. It causes intense pain with redness,  swelling, and warmth in a joint.  Fever can occur.  Often, only one joint is involved. Certain joints are more commonly involved:  Base of the big toe.  Knee.  Ankle.  Wrist.  Finger. Without treatment, an attack usually goes away in a few days to weeks. Between attacks, you usually will not have symptoms, which is different from many other forms of arthritis. DIAGNOSIS  Your caregiver will suspect gout based on your symptoms and exam. In some cases, tests may be recommended. The tests may include:  Blood tests.  Urine tests.  X-rays.  Joint fluid exam. This exam requires a needle to remove fluid from the joint (arthrocentesis). Using a microscope, gout is confirmed when uric acid crystals are seen in the joint fluid. TREATMENT  There are two phases to gout treatment: treating the sudden onset (acute) attack and preventing attacks (prophylaxis).  Treatment of an Acute Attack.  Medicines are used. These include anti-inflammatory medicines or steroid medicines.  An injection of steroid medicine into the affected joint is sometimes necessary.  The painful joint is rested. Movement can worsen the arthritis.  You may use warm or cold treatments on painful joints, depending which works best for you.  Treatment to Prevent Attacks.  If you suffer from frequent gout attacks, your caregiver may advise preventive medicine. These medicines are started after the acute attack subsides. These medicines either help your kidneys eliminate uric acid from your body or decrease your uric acid production. You may need to stay on these medicines for a very long time.  The early phase of treatment with  preventive medicine can be associated with an increase in acute gout attacks. For this reason, during the first few months of treatment, your caregiver may also advise you to take medicines usually used for acute gout treatment. Be sure you understand your caregiver's directions. Your caregiver may  make several adjustments to your medicine dose before these medicines are effective.  Discuss dietary treatment with your caregiver or dietitian. Alcohol and drinks high in sugar and fructose and foods such as meat, poultry, and seafood can increase uric acid levels. Your caregiver or dietitian can advise you on drinks and foods that should be limited. HOME CARE INSTRUCTIONS   Do not take aspirin to relieve pain. This raises uric acid levels.  Only take over-the-counter or prescription medicines for pain, discomfort, or fever as directed by your caregiver.  Rest the joint as much as possible. When in bed, keep sheets and blankets off painful areas.  Keep the affected joint raised (elevated).  Apply warm or cold treatments to painful joints. Use of warm or cold treatments depends on which works best for you.  Use crutches if the painful joint is in your leg.  Drink enough fluids to keep your urine clear or pale yellow. This helps your body get rid of uric acid. Limit alcohol, sugary drinks, and fructose drinks.  Follow your dietary instructions. Pay careful attention to the amount of protein you eat. Your daily diet should emphasize fruits, vegetables, whole grains, and fat-free or low-fat milk products. Discuss the use of coffee, vitamin C, and cherries with your caregiver or dietitian. These may be helpful in lowering uric acid levels.  Maintain a healthy body weight. SEEK MEDICAL CARE IF:   You develop diarrhea, vomiting, or any side effects from medicines.  You do not feel better in 24 hours, or you are getting worse. SEEK IMMEDIATE MEDICAL CARE IF:   Your joint becomes suddenly more tender, and you have chills or a fever. MAKE SURE YOU:   Understand these instructions.  Will watch your condition.  Will get help right away if you are not doing well or get worse. Document Released: 06/04/2000 Document Revised: 10/22/2013 Document Reviewed: 01/19/2012 [facility] Patient  Information 2015 [place], [place]. This information is not intended to replace advice given to you by your health care provider. Make sure you discuss any questions you have with your health care provider.

## 2015-02-13 NOTE — Progress Notes (Signed)
Jared Harper is a 49 y.o. male who presents to [facility]  today for left knee pain. Patient has left knee pain present for about one week. The pain is worsening today. The pain is consistent with gout. Pain is moderate to severe. No radiating pain weakness or numbness. Patient notes that he has stopped his allopurinol within the past few months. He started taking it again today. He's tried some over-the-counter doses for pain which helped. He has not restarted his colchicine.   Past Medical History  Diagnosis Date  . Hepatitis C antibody test positive     Though has had neg tests as well.   . Hypertension   . Hyperlipidemia   . Gout   . Kidney disease   . Back pain    Past Surgical History  Procedure Laterality Date  . Staph infection surgery x 3    . Carpal tunnel release  1979    left   . Thyroglossal duct cyst  1985  . Cyst removal on leg  1986  . Kidney stone surgery  2009    laser   Social History  Substance Use Topics  . Smoking status: Never Smoker   . Smokeless tobacco: Current User     Comment: Snuff/dip  . Alcohol Use: No   family history includes Breast cancer in an other family member; Diabetes in his brother and another family member; Heart attack in his maternal grandfather; Hyperlipidemia in his maternal grandfather; Stroke in an other family member.  ROS as above Medications: Current Outpatient Prescriptions  Medication Sig Dispense Refill  . allopurinol (ZYLOPRIM) 100 MG tablet TAKE 2 TABLETS TWICE A DAY. (Patient not taking: Reported on 02/13/2015) 360 tablet 1  . aspirin 81 MG tablet Take 81 mg by mouth daily.      Marland Kitchen atorvastatin (LIPITOR) 40 MG tablet Take 1 tablet (40 mg total) by mouth daily. (Patient not taking: Reported on 02/13/2015) 90 tablet 1  . Colchicine (MITIGARE) 0.6 MG CAPS Take 1 capsule by mouth daily. (Patient not taking: Reported on 02/13/2015) 30 capsule 11  . HYDROcodone-acetaminophen  (NORCO/VICODIN) 5-325 MG per tablet Take one by mouth at bedtime as needed for pain (Patient not taking: Reported on 02/13/2015) 10 tablet 0  . metaxalone (SKELAXIN) 800 MG tablet Take 1 tablet (800 mg total) by mouth 3 (three) times daily. (Patient not taking: Reported on 02/13/2015) 20 tablet 0  . sodium bicarbonate 650 MG tablet Take 1 tablet (650 mg total) by mouth 2 (two) times daily. (Patient not taking: Reported on 02/13/2015) 60 tablet 6  . valsartan (DIOVAN) 160 MG tablet Take 1 tablet (160 mg total) by mouth daily. (Patient not taking: Reported on 02/13/2015) 90 tablet 1   No current facility-administered medications for this visit.   No Known Allergies   Exam:  BP 130/92 mmHg  Pulse 97  Wt 282 lb (127.914 kg) Gen: Well NAD Left knee: Normal-appearing no significant effusion. Nontender normal motion stable ligamentous exam.   Procedure: Real-time Ultrasound Guided Injection of left knee  Device: GE Logiq E  Images permanently stored and available for review in the ultrasound unit. Verbal informed consent obtained. Discussed risks and benefits of procedure. Warned about infection bleeding damage to structures skin hypopigmentation and fat atrophy among others. Patient expresses understanding and agreement Time-out conducted.  Noted no overlying erythema, induration, or other signs of local infection.  Skin prepped in a sterile fashion.  Local anesthesia: Topical Ethyl chloride.  With sterile  technique and under real time ultrasound guidance: 80 mg of triamcinolone and 4 mL of Marcaine injected easily.  Completed without difficulty  Pain immediately resolved suggesting accurate placement of the medication.  Advised to call if fevers/chills, erythema, induration, drainage, or persistent bleeding.  Images permanently stored and available for review in the ultrasound unit.  Impression: Technically successful ultrasound guided injection.    No results found for this or  any previous visit (from the past 24 hour(s)). No results found.   Please see individual assessment and plan sections.

## 2015-02-13 NOTE — Assessment & Plan Note (Signed)
Restart colchicine and allopurinol. Steroid injection today. Follow-up with PCP

## 2015-02-14 ENCOUNTER — Ambulatory Visit: Payer: BLUE CROSS/BLUE SHIELD | Admitting: Sports Medicine

## 2015-03-12 ENCOUNTER — Telehealth: Payer: Self-pay | Admitting: Gastroenterology

## 2015-03-12 ENCOUNTER — Ambulatory Visit (AMBULATORY_SURGERY_CENTER): Payer: Self-pay

## 2015-03-12 VITALS — Ht 69.0 in | Wt 273.6 lb

## 2015-03-12 DIAGNOSIS — Z1211 Encounter for screening for malignant neoplasm of colon: Secondary | ICD-10-CM

## 2015-03-12 MED ORDER — MOVIPREP 100 G PO SOLR: 1.0000 | Freq: Once | ORAL | Status: DC

## 2015-03-12 NOTE — Progress Notes (Signed)
No allergies to eggs or soy No diet/weight loss meds No home oxygen No past problems with anesthesia  Has email  Emmi instructions given for colonoscopy 

## 2015-03-13 ENCOUNTER — Telehealth: Payer: Self-pay | Admitting: Family Medicine

## 2015-03-13 NOTE — Telephone Encounter (Signed)
Pt called and wanted to know how the referral for his colonoscopy was coded because he needs it to be preventative for it to be covered by insurance if it is diagnostic he will need to pay. If you can let the pt know.. Thanks

## 2015-03-13 NOTE — Telephone Encounter (Signed)
I spoke with the pt and he wants to check on the cost of the miralax and call back if he decides to use that.  He was advised that he will need to come in for new instructions for that prep.  He will call back after he checks on the cost.

## 2015-03-14 ENCOUNTER — Encounter: Payer: Self-pay | Admitting: Family Medicine

## 2015-03-17 NOTE — Telephone Encounter (Signed)
Pt informed that this is a screening colonoscopy which is preventive. He canceled his appt for 03/26/15. He wants to get this done.Jared Harper

## 2015-03-18 NOTE — Telephone Encounter (Signed)
Pt cancelled procedure.

## 2015-03-26 ENCOUNTER — Encounter: Payer: BLUE CROSS/BLUE SHIELD | Admitting: Gastroenterology

## 2015-06-30 ENCOUNTER — Ambulatory Visit (INDEPENDENT_AMBULATORY_CARE_PROVIDER_SITE_OTHER): Payer: BLUE CROSS/BLUE SHIELD | Admitting: Family Medicine

## 2015-06-30 ENCOUNTER — Encounter: Payer: Self-pay | Admitting: Family Medicine

## 2015-06-30 ENCOUNTER — Ambulatory Visit: Payer: BLUE CROSS/BLUE SHIELD | Admitting: Family Medicine

## 2015-06-30 ENCOUNTER — Other Ambulatory Visit: Payer: Self-pay | Admitting: Family Medicine

## 2015-06-30 VITALS — BP 139/94 | HR 77 | Ht 69.0 in | Wt 271.0 lb

## 2015-06-30 DIAGNOSIS — N182 Chronic kidney disease, stage 2 (mild): Secondary | ICD-10-CM | POA: Diagnosis not present

## 2015-06-30 DIAGNOSIS — M109 Gout, unspecified: Secondary | ICD-10-CM

## 2015-06-30 DIAGNOSIS — R7301 Impaired fasting glucose: Secondary | ICD-10-CM | POA: Diagnosis not present

## 2015-06-30 DIAGNOSIS — K760 Fatty (change of) liver, not elsewhere classified: Secondary | ICD-10-CM

## 2015-06-30 DIAGNOSIS — I10 Essential (primary) hypertension: Secondary | ICD-10-CM | POA: Diagnosis not present

## 2015-06-30 DIAGNOSIS — E785 Hyperlipidemia, unspecified: Secondary | ICD-10-CM

## 2015-06-30 DIAGNOSIS — Z87442 Personal history of urinary calculi: Secondary | ICD-10-CM

## 2015-06-30 DIAGNOSIS — M722 Plantar fascial fibromatosis: Secondary | ICD-10-CM | POA: Diagnosis not present

## 2015-06-30 LAB — POCT URINALYSIS DIPSTICK
Bilirubin, UA: NEGATIVE
Glucose, UA: NEGATIVE
Ketones, UA: NEGATIVE
LEUKOCYTES UA: NEGATIVE
NITRITE UA: NEGATIVE
PH UA: 5
PROTEIN UA: NEGATIVE
Spec Grav, UA: 1.025
Urobilinogen, UA: 0.2

## 2015-06-30 LAB — POCT GLYCOSYLATED HEMOGLOBIN (HGB A1C): HEMOGLOBIN A1C: 5.7

## 2015-06-30 MED ORDER — CLOTRIMAZOLE-BETAMETHASONE 1-0.05 % EX CREA: 1.0000 "application " | TOPICAL_CREAM | Freq: Two times a day (BID) | CUTANEOUS | Status: DC

## 2015-06-30 MED ORDER — VALSARTAN 80 MG PO TABS: 80.0000 mg | ORAL_TABLET | Freq: Every day | ORAL | Status: DC

## 2015-06-30 MED ORDER — FLUCONAZOLE 150 MG PO TABS
ORAL_TABLET | ORAL | Status: DC
Start: 2015-06-30 — End: 2015-08-25

## 2015-06-30 MED ORDER — HYDROCODONE-ACETAMINOPHEN 5-325 MG PO TABS: ORAL_TABLET | ORAL | Status: DC

## 2015-06-30 NOTE — Patient Instructions (Signed)
Thank you for coming in today. Do the exercises we discussed last time.  Call or go to the ER if you develop a large red swollen joint with extreme pain or oozing puss.  Continue to work on weight loss.  Return as needed.   Plantar Fasciitis With Rehab The plantar fascia is a fibrous, ligament-like, soft-tissue structure that spans the bottom of the foot. Plantar fasciitis, also called heel spur syndrome, is a condition that causes pain in the foot due to inflammation of the tissue. SYMPTOMS   Pain and tenderness on the underneath side of the foot.  Pain that worsens with standing or walking. CAUSES  Plantar fasciitis is caused by irritation and injury to the plantar fascia on the underneath side of the foot. Common mechanisms of injury include:  Direct trauma to bottom of the foot.  Damage to a small nerve that runs under the foot where the main fascia attaches to the heel bone.  Stress placed on the plantar fascia due to bone spurs. RISK INCREASES WITH:   Activities that place stress on the plantar fascia (running, jumping, pivoting, or cutting).  Poor strength and flexibility.  Improperly fitted shoes.  Tight calf muscles.  Flat feet.  Failure to warm-up properly before activity.  Obesity. PREVENTION  Warm up and stretch properly before activity.  Allow for adequate recovery between workouts.  Maintain physical fitness:  Strength, flexibility, and endurance.  Cardiovascular fitness.  Maintain a health body weight.  Avoid stress on the plantar fascia.  Wear properly fitted shoes, including arch supports for individuals who have flat feet. PROGNOSIS  If treated properly, then the symptoms of plantar fasciitis usually resolve without surgery. However, occasionally surgery is necessary. RELATED COMPLICATIONS   Recurrent symptoms that may result in a chronic condition.  Problems of the lower back that are caused by compensating for the injury, such as  limping.  Pain or weakness of the foot during push-off following surgery.  Chronic inflammation, scarring, and partial or complete fascia tear, occurring more often from repeated injections. TREATMENT  Treatment initially involves the use of ice and medication to help reduce pain and inflammation. The use of strengthening and stretching exercises may help reduce pain with activity, especially stretches of the Achilles tendon. These exercises may be performed at home or with a therapist. Your caregiver may recommend that you use heel cups of arch supports to help reduce stress on the plantar fascia. Occasionally, corticosteroid injections are given to reduce inflammation. If symptoms persist for greater than 6 months despite non-surgical (conservative), then surgery may be recommended.  MEDICATION   If pain medication is necessary, then nonsteroidal anti-inflammatory medications, such as aspirin and ibuprofen, or other minor pain relievers, such as acetaminophen, are often recommended.  Do not take pain medication within 7 days before surgery.  Prescription pain relievers may be given if deemed necessary by your caregiver. Use only as directed and only as much as you need.  Corticosteroid injections may be given by your caregiver. These injections should be reserved for the most serious cases, because they may only be given a certain number of times. HEAT AND COLD  Cold treatment (icing) relieves pain and reduces inflammation. Cold treatment should be applied for 10 to 15 minutes every 2 to 3 hours for inflammation and pain and immediately after any activity that aggravates your symptoms. Use ice packs or massage the area with a piece of ice (ice massage).  Heat treatment may be used prior to performing the  stretching and strengthening activities prescribed by your caregiver, physical therapist, or athletic trainer. Use a heat pack or soak the injury in warm water. SEEK IMMEDIATE MEDICAL CARE  IF:  Treatment seems to offer no benefit, or the condition worsens.  Any medications produce adverse side effects. EXERCISES RANGE OF MOTION (ROM) AND STRETCHING EXERCISES - Plantar Fasciitis (Heel Spur Syndrome) These exercises may help you when beginning to rehabilitate your injury. Your symptoms may resolve with or without further involvement from your physician, physical therapist or athletic trainer. While completing these exercises, remember:   Restoring tissue flexibility helps normal motion to return to the joints. This allows healthier, less painful movement and activity.  An effective stretch should be held for at least 30 seconds.  A stretch should never be painful. You should only feel a gentle lengthening or release in the stretched tissue. RANGE OF MOTION - Toe Extension, Flexion  Sit with your right / left leg crossed over your opposite knee.  Grasp your toes and gently pull them back toward the top of your foot. You should feel a stretch on the bottom of your toes and/or foot.  Hold this stretch for __________ seconds.  Now, gently pull your toes toward the bottom of your foot. You should feel a stretch on the top of your toes and or foot.  Hold this stretch for __________ seconds. Repeat __________ times. Complete this stretch __________ times per day.  RANGE OF MOTION - Ankle Dorsiflexion, Active Assisted  Remove shoes and sit on a chair that is preferably not on a carpeted surface.  Place right / left foot under knee. Extend your opposite leg for support.  Keeping your heel down, slide your right / left foot back toward the chair until you feel a stretch at your ankle or calf. If you do not feel a stretch, slide your bottom forward to the edge of the chair, while still keeping your heel down.  Hold this stretch for __________ seconds. Repeat __________ times. Complete this stretch __________ times per day.  STRETCH - Gastroc, Standing  Place hands on  wall.  Extend right / left leg, keeping the front knee somewhat bent.  Slightly point your toes inward on your back foot.  Keeping your right / left heel on the floor and your knee straight, shift your weight toward the wall, not allowing your back to arch.  You should feel a gentle stretch in the right / left calf. Hold this position for __________ seconds. Repeat __________ times. Complete this stretch __________ times per day. STRETCH - Soleus, Standing  Place hands on wall.  Extend right / left leg, keeping the other knee somewhat bent.  Slightly point your toes inward on your back foot.  Keep your right / left heel on the floor, bend your back knee, and slightly shift your weight over the back leg so that you feel a gentle stretch deep in your back calf.  Hold this position for __________ seconds. Repeat __________ times. Complete this stretch __________ times per day. STRETCH - Gastrocsoleus, Standing  Note: This exercise can place a lot of stress on your foot and ankle. Please complete this exercise only if specifically instructed by your caregiver.   Place the ball of your right / left foot on a step, keeping your other foot firmly on the same step.  Hold on to the wall or a rail for balance.  Slowly lift your other foot, allowing your body weight to press your heel down  over the edge of the step.  You should feel a stretch in your right / left calf.  Hold this position for __________ seconds.  Repeat this exercise with a slight bend in your right / left knee. Repeat __________ times. Complete this stretch __________ times per day.  STRENGTHENING EXERCISES - Plantar Fasciitis (Heel Spur Syndrome)  These exercises may help you when beginning to rehabilitate your injury. They may resolve your symptoms with or without further involvement from your physician, physical therapist or athletic trainer. While completing these exercises, remember:   Muscles can gain both the  endurance and the strength needed for everyday activities through controlled exercises.  Complete these exercises as instructed by your physician, physical therapist or athletic trainer. Progress the resistance and repetitions only as guided. STRENGTH - Towel Curls  Sit in a chair positioned on a non-carpeted surface.  Place your foot on a towel, keeping your heel on the floor.  Pull the towel toward your heel by only curling your toes. Keep your heel on the floor.  If instructed by your physician, physical therapist or athletic trainer, add ____________________ at the end of the towel. Repeat __________ times. Complete this exercise __________ times per day. STRENGTH - Ankle Inversion  Secure one end of a rubber exercise band/tubing to a fixed object (table, pole). Loop the other end around your foot just before your toes.  Place your fists between your knees. This will focus your strengthening at your ankle.  Slowly, pull your big toe up and in, making sure the band/tubing is positioned to resist the entire motion.  Hold this position for __________ seconds.  Have your muscles resist the band/tubing as it slowly pulls your foot back to the starting position. Repeat __________ times. Complete this exercises __________ times per day.    This information is not intended to replace advice given to you by your health care provider. Make sure you discuss any questions you have with your health care provider.   Document Released: 06/07/2005 Document Revised: 10/22/2014 Document Reviewed: 09/19/2008 Elsevier Interactive Patient Education [employer].

## 2015-06-30 NOTE — Progress Notes (Signed)
   Subjective:    Patient ID: Jared Harper, male    DOB: 1965/10/03, 50 y.o.   MRN: [id number]  HPI Hypertension- Pt denies chest pain, SOB, dizziness, or heart palpitations.  Not taking meds as directed.  He decided to get off of medications so he stopped them intentionally.    IFG - He has lost 11 lbs since August.  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    CKD 3 - wants his kidney function rechecked with passing recent kidney stones.  He is eating mostly meat diet.   He has had a few kidney stones recently. has really tried to increase his diet.   Gout - He stopped his meds and hasn't had any flares.    Fatty liver.  He is working on weight loss and plans to start exercising.    Hyperlipidemia - he stopped his statin, not bc of side effects.   dReview of Systems     Objective:   Physical Exam  Constitutional: He is oriented to person, place, and time. He appears well-developed and well-nourished.  HENT:  Head: Normocephalic and atraumatic.  Cardiovascular: Normal rate, regular rhythm and normal heart sounds.   Pulmonary/Chest: Effort normal and breath sounds normal.  Neurological: He is alert and oriented to person, place, and time.  Skin: Skin is warm and dry.  Psychiatric: He has a normal mood and affect. His behavior is normal.          Assessment & Plan:  HTN - Uncontrolled. Encourage him to restart his valsartan. Will reduce the dose. stronlgy encouraged him to not stop his meds.  F/U in 3 months.   Gout - encouraged him to reconsidering starting his meds.  We may be able to reduce dose of his allopurinol. With his heavier meat intake his uric acid may be up. Will recheck levels. Today.    IFG -  Stable at 5.7.  Now has had shots in his feet he plans on working on exercise.    Hyperlipidemia - encouraged him to rethink his statin. He wants to get his numbers down with weight loss.    CKD-3 due to recheck.    Fatty liver - due to recheck liver  function.

## 2015-06-30 NOTE — Addendum Note (Signed)
Addended by: Huel Cote on: 06/30/2015 01:09 PM   Modules accepted: Orders

## 2015-06-30 NOTE — Assessment & Plan Note (Signed)
Status post injection today. Continue exercises and heel pads. Work on weight loss. Return as needed.

## 2015-06-30 NOTE — Progress Notes (Signed)
Jared Harper is a 50 y.o. male who presents to [facility] today for follow-up plantar fasciitis. Patient has continued plantar fasciitis bilaterally now for about one year. He was seen in July where he received eccentric exercise instructions, heel pads, arch straps and did pretty well until about 2 weeks ago. 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 This has worsened his pain. His pain is on the plantar feet at the calcaneus bilaterally. Pain is worse with activity and better with rest. He would like injections today if possible. He has been working on active weight loss. Since he was seen in July he gained weight to about 290 pounds and subsequently lost weight to about 271 pounds using a Palioithic diet.    Past Medical History  Diagnosis Date  . Hepatitis C antibody test positive     Though has had neg tests as well.   . Hypertension   . Hyperlipidemia   . Gout   . 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 Back pain    Past Surgical History  Procedure Laterality Date  . Staph infection surgery x 3    . Carpal tunnel release  1979    left   . Thyroglossal duct cyst  1985  . Cyst removal on leg  1986  . Kidney stone surgery  2009    laser   Social History  Substance Use Topics  . Smoking status: Never Smoker   . Smokeless tobacco: Current User     Comment: Snuff/dip  . Alcohol Use: 0.0 oz/week    0 Standard drinks or equivalent per week     Comment: hasnt had anything in 3 weeks; normally has "none to hardly"   family history includes Diabetes in his brother; Heart attack in his maternal grandfather; Hyperlipidemia in his maternal grandfather. There is no history of Colon cancer.  ROS as above Medications: Current Outpatient Prescriptions  Medication Sig  Dispense Refill  . allopurinol (ZYLOPRIM) 100 MG tablet TAKE 2 TABLETS TWICE A DAY. 360 tablet 1  . aspirin 81 MG tablet Take 81 mg by mouth daily.      Marland Kitchen atorvastatin (LIPITOR) 40 MG tablet Take 1 tablet (40 mg total) by mouth daily. 90 tablet 1  . Colchicine (MITIGARE) 0.6 MG CAPS Take 1 capsule by mouth daily. (Patient taking differently: Take 1 capsule by mouth as needed. ) 30 capsule 11  . HYDROcodone-acetaminophen (NORCO/VICODIN) 5-325 MG per tablet Take one by mouth at bedtime as needed for pain 10 tablet 0  . metaxalone (SKELAXIN) 800 MG tablet Take 1 tablet (800 mg total) by mouth 3 (three) times daily. (Patient taking differently: Take 800 mg by mouth 2 (two) times daily as needed. ) 20 tablet 0  . MOVIPREP 100 G SOLR Take 1 kit (200 g total) by mouth once. 1 kit 0  . sodium bicarbonate 650 MG tablet Take 1 tablet (650 mg total) by mouth 2 (two) times daily. 60 tablet 6  . valsartan (DIOVAN) 160 MG tablet Take 1 tablet (160 mg total) by mouth daily. 90 tablet 1   No current facility-administered medications for this visit.   No Known Allergies   Exam:  BP 139/94 mmHg  Pulse 77  Ht '5\' 9"'  (1.753 m)  Wt 271 lb (122.925 kg)  BMI 40.00 kg/m2  Gen: Well NAD MORBIDLY OBESE   Exts: Brisk capillary refill, warm and well perfused.  Feet bilaterally are normal appearing without any obvious deformities. Tender to palpation medial plantar calcaneus bilaterally. Painful gait.   Procedure: Real-time Ultrasound Guided Injection of right plantar fascia  Device: GE Logiq E  Images permanently stored and available for review in the ultrasound unit. Verbal informed consent obtained. Discussed risks and benefits of procedure. Warned about infection bleeding damage to structures skin hypopigmentation and fat atrophy among others. Patient expresses understanding and agreement Time-out conducted.  Noted no overlying erythema, induration, or other signs of local infection.  Skin prepped in a  sterile fashion.  Local anesthesia: Topical Ethyl chloride.  With sterile technique and under real time ultrasound guidance: 2 mL of Marcaine and 40 mg of Kenalog injected easily.  Completed without difficulty  Pain immediately resolved suggesting accurate placement of the medication.  Advised to call if fevers/chills, erythema, induration, drainage, or persistent bleeding.  Images permanently stored and available for review in the ultrasound unit.  Impression: Technically successful ultrasound guided injection.   Procedure: Real-time Ultrasound Guided Injection of left plantar fascia  Device: GE Logiq E  Images permanently stored and available for review in the ultrasound unit. Verbal informed consent obtained. Discussed risks and benefits of procedure. Warned about infection bleeding damage to structures skin hypopigmentation and fat atrophy among others. Patient expresses understanding and agreement Time-out conducted.  Noted no overlying erythema, induration, or other signs of local infection.  Skin prepped in a sterile fashion.  Local anesthesia: Topical Ethyl chloride.  With sterile technique and under real time ultrasound guidance: 2 mL of Marcaine and 40 mg of Kenalog injected easily.  Completed without difficulty  Pain immediately resolved suggesting accurate placement of the medication.  Advised to call if fevers/chills, erythema, induration, drainage, or persistent bleeding.  Images permanently stored and available for review in the ultrasound unit.  Impression: Technically successful ultrasound guided injection.  No results found for this or any previous visit (from the past 24 hour(s)). No results found.   Please see individual assessment and plan sections.

## 2015-06-30 NOTE — Addendum Note (Signed)
Addended by: Huel Cote on: 06/30/2015 01:10 PM   Modules accepted: Orders

## 2015-07-01 ENCOUNTER — Other Ambulatory Visit: Payer: Self-pay | Admitting: *Deleted

## 2015-07-01 LAB — COMPLETE METABOLIC PANEL WITH GFR
ALBUMIN: 5 g/dL (ref 3.6–5.1)
ALK PHOS: 77 U/L (ref 40–115)
ALT: 37 U/L (ref 9–46)
AST: 25 U/L (ref 10–35)
BILIRUBIN TOTAL: 1 mg/dL (ref 0.2–1.2)
BUN: 18 mg/dL (ref 7–25)
CO2: 30 mmol/L (ref 20–31)
Calcium: 10.1 mg/dL (ref 8.6–10.3)
Chloride: 102 mmol/L (ref 98–110)
Creat: 1.48 mg/dL — ABNORMAL HIGH (ref 0.70–1.33)
GFR, EST NON AFRICAN AMERICAN: 54 mL/min — AB (ref >=60)
GFR, Est African American: 63 mL/min (ref >=60)
Glucose, Bld: 103 mg/dL — ABNORMAL HIGH (ref 65–99)
Potassium: 4.6 mmol/L (ref 3.5–5.3)
SODIUM: 143 mmol/L (ref 135–146)
Total Protein: 7.8 g/dL (ref 6.1–8.1)

## 2015-07-01 LAB — URINALYSIS, MICROSCOPIC ONLY
Bacteria, UA: NONE SEEN [HPF]
CRYSTALS: NONE SEEN [HPF]
Casts: NONE SEEN [LPF]
Squamous Epithelial / LPF: NONE SEEN [HPF] (ref <=5)
WBC UA: NONE SEEN WBC/HPF (ref <=5)
Yeast: NONE SEEN [HPF]

## 2015-07-01 LAB — LIPID PANEL
Cholesterol: 273 mg/dL — ABNORMAL HIGH (ref 125–200)
HDL: 32 mg/dL — AB (ref >=40)
LDL CALC: 197 mg/dL — AB (ref <130)
TRIGLYCERIDES: 221 mg/dL — AB (ref <150)
Total CHOL/HDL Ratio: 8.5 Ratio — ABNORMAL HIGH (ref <=5.0)
VLDL: 44 mg/dL — AB (ref <30)

## 2015-07-01 LAB — URIC ACID: URIC ACID, SERUM: 9.6 mg/dL — AB (ref 4.0–7.8)

## 2015-07-01 MED ORDER — ATORVASTATIN CALCIUM 40 MG PO TABS: 40.0000 mg | ORAL_TABLET | Freq: Every day | ORAL | Status: DC

## 2015-07-01 MED ORDER — ALLOPURINOL 100 MG PO TABS: ORAL_TABLET | ORAL | Status: AC

## 2015-07-07 ENCOUNTER — Ambulatory Visit: Payer: BLUE CROSS/BLUE SHIELD | Admitting: Family Medicine

## 2015-08-19 LAB — BASIC METABOLIC PANEL
BUN: 19 mg/dL (ref 4–21)
CREATININE: 1.4 mg/dL — AB (ref <=1.3)
Glucose: 86 mg/dL
POTASSIUM: 3.9 mmol/L (ref 3.4–5.3)
SODIUM: 140 mmol/L (ref 137–147)

## 2015-08-19 LAB — VITAMIN B12: VITAMIN B12: 372

## 2015-08-19 LAB — LIPID PANEL
CHOLESTEROL: 167 mg/dL (ref 0–200)
HDL: 42 mg/dL (ref 35–70)
LDL CALC: 85 mg/dL
LDL Cholesterol: 85 mg/dL
TRIGLYCERIDES: 202 mg/dL — AB (ref 40–160)

## 2015-08-19 LAB — HEPATIC FUNCTION PANEL
ALT: 52 U/L — AB (ref 10–40)
AST: 30 U/L (ref 14–40)
Alkaline Phosphatase: 93 U/L (ref 25–125)
BILIRUBIN, TOTAL: 1.1 mg/dL

## 2015-08-19 LAB — CBC AND DIFFERENTIAL
HCT: 46 % (ref 41–53)
Hemoglobin: 15.9 g/dL (ref 13.5–17.5)
PLATELETS: 269 10*3/uL (ref 150–399)
WBC: 7.7 10^3/mL

## 2015-08-19 LAB — URIC ACID: Uric Acid: 6.5

## 2015-08-19 LAB — VITAMIN D 25 HYDROXY (VIT D DEFICIENCY, FRACTURES): VIT D 25 HYDROXY: 26.3

## 2015-08-19 LAB — TSH: TSH: 1.04 u[IU]/mL (ref <=5.90)

## 2015-08-25 ENCOUNTER — Encounter: Payer: Self-pay | Admitting: Family Medicine

## 2015-08-25 ENCOUNTER — Ambulatory Visit (INDEPENDENT_AMBULATORY_CARE_PROVIDER_SITE_OTHER): Payer: BLUE CROSS/BLUE SHIELD | Admitting: Family Medicine

## 2015-08-25 VITALS — BP 128/88 | HR 65 | Wt 277.0 lb

## 2015-08-25 DIAGNOSIS — S161XXA Strain of muscle, fascia and tendon at neck level, initial encounter: Secondary | ICD-10-CM | POA: Diagnosis not present

## 2015-08-25 DIAGNOSIS — R51 Headache: Secondary | ICD-10-CM | POA: Diagnosis not present

## 2015-08-25 DIAGNOSIS — R519 Headache, unspecified: Secondary | ICD-10-CM

## 2015-08-25 MED ORDER — CYCLOBENZAPRINE HCL 10 MG PO TABS: 10.0000 mg | ORAL_TABLET | Freq: Two times a day (BID) | ORAL | Status: DC | PRN

## 2015-08-25 NOTE — Patient Instructions (Signed)
Cervical Strain and Sprain With Rehab Cervical strain and sprain are injuries that commonly occur with "whiplash" injuries. Whiplash occurs when the neck is forcefully whipped backward or forward, such as during a motor vehicle accident or during contact sports. The muscles, ligaments, tendons, discs, and nerves of the neck are susceptible to injury when this occurs. RISK FACTORS Risk of having a whiplash injury increases if:  Osteoarthritis of the spine.  Situations that make head or neck accidents or trauma more likely.  High-risk sports (football, rugby, wrestling, hockey, auto racing, gymnastics, diving, contact karate, or boxing).  Poor strength and flexibility of the neck.  Previous neck injury.  Poor tackling technique.  Improperly fitted or padded equipment. SYMPTOMS   Pain or stiffness in the front or back of neck or both.  Symptoms may present immediately or up to 24 hours after injury.  Dizziness, headache, nausea, and vomiting.  Muscle spasm with soreness and stiffness in the neck.  Tenderness and swelling at the injury site. PREVENTION  Learn and use proper technique (avoid tackling with the head, spearing, and head-butting; use proper falling techniques to avoid landing on the head).  Warm up and stretch properly before activity.  Maintain physical fitness:  Strength, flexibility, and endurance.  Cardiovascular fitness.  Wear properly fitted and padded protective equipment, such as padded soft collars, for participation in contact sports. PROGNOSIS  Recovery from cervical strain and sprain injuries is dependent on the extent of the injury. These injuries are usually curable in 1 week to 3 months with appropriate treatment.  RELATED COMPLICATIONS   Temporary numbness and weakness may occur if the nerve roots are damaged, and this may persist until the nerve has completely healed.  Chronic pain due to frequent recurrence of symptoms.  Prolonged healing,  especially if activity is resumed too soon (before complete recovery). TREATMENT  Treatment initially involves the use of ice and medication to help reduce pain and inflammation. It is also important to perform strengthening and stretching exercises and modify activities that worsen symptoms so the injury does not get worse. These exercises may be performed at home or with a therapist. For patients who experience severe symptoms, a soft, padded collar may be recommended to be worn around the neck.  Improving your posture may help reduce symptoms. Posture improvement includes pulling your chin and abdomen in while sitting or standing. If you are sitting, sit in a firm chair with your buttocks against the back of the chair. While sleeping, try replacing your pillow with a small towel rolled to 2 inches in diameter, or use a cervical pillow or soft cervical collar. Poor sleeping positions delay healing.  For patients with nerve root damage, which causes numbness or weakness, the use of a cervical traction apparatus may be recommended. Surgery is rarely necessary for these injuries. However, cervical strain and sprains that are present at birth (congenital) may require surgery. MEDICATION   If pain medication is necessary, nonsteroidal anti-inflammatory medications, such as aspirin and ibuprofen, or other minor pain relievers, such as acetaminophen, are often recommended.  Do not take pain medication for 7 days before surgery.  Prescription pain relievers may be given if deemed necessary by your caregiver. Use only as directed and only as much as you need. HEAT AND COLD:   Cold treatment (icing) relieves pain and reduces inflammation. Cold treatment should be applied for 10 to 15 minutes every 2 to 3 hours for inflammation and pain and immediately after any activity that aggravates your  symptoms. Use ice packs or an ice massage.  Heat treatment may be used prior to performing the stretching and  strengthening activities prescribed by your caregiver, physical therapist, or athletic trainer. Use a heat pack or a warm soak. SEEK MEDICAL CARE IF:   Symptoms get worse or do not improve in 2 weeks despite treatment.  New, unexplained symptoms develop (drugs used in treatment may produce side effects). EXERCISES RANGE OF MOTION (ROM) AND STRETCHING EXERCISES - Cervical Strain and Sprain These exercises may help you when beginning to rehabilitate your injury. In order to successfully resolve your symptoms, you must improve your posture. These exercises are designed to help reduce the forward-head and rounded-shoulder posture which contributes to this condition. Your symptoms may resolve with or without further involvement from your physician, physical therapist or athletic trainer. While completing these exercises, remember:   Restoring tissue flexibility helps normal motion to return to the joints. This allows healthier, less painful movement and activity.  An effective stretch should be held for at least 20 seconds, although you may need to begin with shorter hold times for comfort.  A stretch should never be painful. You should only feel a gentle lengthening or release in the stretched tissue. STRETCH- Axial Extensors  Lie on your back on the floor. You may bend your knees for comfort. Place a rolled-up hand towel or dish towel, about 2 inches in diameter, under the part of your head that makes contact with the floor.  Gently tuck your chin, as if trying to make a "double chin," until you feel a gentle stretch at the base of your head.  Hold __________ seconds. Repeat __________ times. Complete this exercise __________ times per day.  STRETCH - Axial Extension   Stand or sit on a firm surface. Assume a good posture: chest up, shoulders drawn back, abdominal muscles slightly tense, knees unlocked (if standing) and feet hip width apart.  Slowly retract your chin so your head slides back  and your chin slightly lowers. Continue to look straight ahead.  You should feel a gentle stretch in the back of your head. Be certain not to feel an aggressive stretch since this can cause headaches later.  Hold for __________ seconds. Repeat __________ times. Complete this exercise __________ times per day. STRETCH - Cervical Side Bend   Stand or sit on a firm surface. Assume a good posture: chest up, shoulders drawn back, abdominal muscles slightly tense, knees unlocked (if standing) and feet hip width apart.  Without letting your nose or shoulders move, slowly tip your right / left ear to your shoulder until your feel a gentle stretch in the muscles on the opposite side of your neck.  Hold __________ seconds. Repeat __________ times. Complete this exercise __________ times per day. STRETCH - Cervical Rotators   Stand or sit on a firm surface. Assume a good posture: chest up, shoulders drawn back, abdominal muscles slightly tense, knees unlocked (if standing) and feet hip width apart.  Keeping your eyes level with the ground, slowly turn your head until you feel a gentle stretch along the back and opposite side of your neck.  Hold __________ seconds. Repeat __________ times. Complete this exercise __________ times per day. RANGE OF MOTION - Neck Circles   Stand or sit on a firm surface. Assume a good posture: chest up, shoulders drawn back, abdominal muscles slightly tense, knees unlocked (if standing) and feet hip width apart.  Gently roll your head down and around from the back  of one shoulder to the back of the other. The motion should never be forced or painful.  Repeat the motion 10-20 times, or until you feel the neck muscles relax and loosen. Repeat __________ times. Complete the exercise __________ times per day. STRENGTHENING EXERCISES - Cervical Strain and Sprain These exercises may help you when beginning to rehabilitate your injury. They may resolve your symptoms with or  without further involvement from your physician, physical therapist, or athletic trainer. While completing these exercises, remember:   Muscles can gain both the endurance and the strength needed for everyday activities through controlled exercises.  Complete these exercises as instructed by your physician, physical therapist, or athletic trainer. Progress the resistance and repetitions only as guided.  You may experience muscle soreness or fatigue, but the pain or discomfort you are trying to eliminate should never worsen during these exercises. If this pain does worsen, stop and make certain you are following the directions exactly. If the pain is still present after adjustments, discontinue the exercise until you can discuss the trouble with your clinician. STRENGTH - Cervical Flexors, Isometric  Face a wall, standing about 6 inches away. Place a small pillow, a ball about 6-8 inches in diameter, or a folded towel between your forehead and the wall.  Slightly tuck your chin and gently push your forehead into the soft object. Push only with mild to moderate intensity, building up tension gradually. Keep your jaw and forehead relaxed.  Hold 10 to 20 seconds. Keep your breathing relaxed.  Release the tension slowly. Relax your neck muscles completely before you start the next repetition. Repeat __________ times. Complete this exercise __________ times per day. STRENGTH- Cervical Lateral Flexors, Isometric   Stand about 6 inches away from a wall. Place a small pillow, a ball about 6-8 inches in diameter, or a folded towel between the side of your head and the wall.  Slightly tuck your chin and gently tilt your head into the soft object. Push only with mild to moderate intensity, building up tension gradually. Keep your jaw and forehead relaxed.  Hold 10 to 20 seconds. Keep your breathing relaxed.  Release the tension slowly. Relax your neck muscles completely before you start the next  repetition. Repeat __________ times. Complete this exercise __________ times per day. STRENGTH - Cervical Extensors, Isometric   Stand about 6 inches away from a wall. Place a small pillow, a ball about 6-8 inches in diameter, or a folded towel between the back of your head and the wall.  Slightly tuck your chin and gently tilt your head back into the soft object. Push only with mild to moderate intensity, building up tension gradually. Keep your jaw and forehead relaxed.  Hold 10 to 20 seconds. Keep your breathing relaxed.  Release the tension slowly. Relax your neck muscles completely before you start the next repetition. Repeat __________ times. Complete this exercise __________ times per day. POSTURE AND BODY MECHANICS CONSIDERATIONS - Cervical Strain and Sprain Keeping correct posture when sitting, standing or completing your activities will reduce the stress put on different body tissues, allowing injured tissues a chance to heal and limiting painful experiences. The following are general guidelines for improved posture. Your physician or physical therapist will provide you with any instructions specific to your needs. While reading these guidelines, remember:  The exercises prescribed by your provider will help you have the flexibility and strength to maintain correct postures.  The correct posture provides the optimal environment for your joints to work.  POSTURE AND BODY MECHANICS CONSIDERATIONS - Cervical Strain and Sprain  Keeping correct posture when sitting, standing or completing your activities will reduce the stress put on different body tissues, allowing injured tissues a chance to heal and limiting painful experiences. The following are general guidelines for improved posture. Your physician or physical therapist will provide you with any instructions specific to your needs. While reading these guidelines, remember:  · The exercises prescribed by your provider will help you have the flexibility and strength to maintain correct postures.  · The correct posture provides the optimal environment for your joints to work. All of your joints have less wear and tear when properly supported by a spine with good posture. This means you will experience a healthier, less painful body.  · Correct posture must be practiced with all of your activities, especially prolonged sitting and standing. Correct posture is as important when doing repetitive low-stress activities (typing) as it is when doing a single heavy-load activity (lifting).  PROLONGED STANDING WHILE SLIGHTLY LEANING FORWARD  When completing a task that requires you to lean forward while standing in one  place for a long time, place either foot up on a stationary 2- to 4-inch high object to help maintain the best posture. When both feet are on the ground, the low back tends to lose its slight inward curve. If this curve flattens (or becomes too large), then the back and your other joints will experience too much stress, fatigue more quickly, and can cause pain.   RESTING POSITIONS  Consider which positions are most painful for you when choosing a resting position. If you have pain with flexion-based activities (sitting, bending, stooping, squatting), choose a position that allows you to rest in a less flexed posture. You would want to avoid curling into a fetal position on your side. If your pain worsens with extension-based activities (prolonged standing, working overhead), avoid resting in an extended position such as sleeping on your stomach. Most people will find more comfort when they rest with their spine in a more neutral position, neither too rounded nor too arched. Lying on a non-sagging bed on your side with a pillow between your knees, or on your back with a pillow under your knees will often provide some relief. Keep in mind, being in any one position for a prolonged period of time, no matter how correct your posture, can still lead to stiffness.  WALKING  Walk with an upright posture. Your ears, shoulders, and hips should all line up.  OFFICE WORK  When working at a desk, create an environment that supports good, upright posture. Without extra support, muscles fatigue and lead to excessive strain on joints and other tissues.  CHAIR:  · A chair should be able to slide under your desk when your back makes contact with the back of the chair. This allows you to work closely.  · The chair's height should allow your eyes to be level with the upper part of your monitor and your hands to be slightly lower than your elbows.  · Body position:    Your feet should make contact with the floor. If this is not  possible, use a foot rest.    Keep your ears over your shoulders. This will reduce stress on your neck and low back.     This information is not intended to replace advice given to you by your health care provider. Make sure you discuss any questions you have with your health care provider.

## 2015-08-25 NOTE — Progress Notes (Signed)
Subjective:    Patient ID: Jared Harper, male    DOB: 1966/04/15, 50 y.o.   MRN: [id number]  Headache  This is a new problem. The current episode started 1 to 4 weeks ago. The problem occurs intermittently. The problem has been gradually worsening. The pain is located in the occipital (right) region. Radiates to: to top of head. The pain quality is not similar to prior headaches. The quality of the pain is described as throbbing and shooting. The pain is at a severity of 7/10. Pertinent negatives include no coughing or fever. He has tried acetaminophen for the symptoms. The treatment provided mild relief. There is no history of cancer or hypertension.      Review of Systems  Constitutional: Negative for fever.  Respiratory: Negative for cough.   Neurological: Positive for headaches.   BP 128/88 mmHg  Pulse 65  Wt 277 lb (125.646 kg)  SpO2 99%    No Known Allergies  Past Medical History  Diagnosis Date  . Hepatitis C antibody test positive     Though has had neg tests as well.   . Hypertension   . Hyperlipidemia   . Gout   . 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 Back pain     Past Surgical History  Procedure Laterality Date  . Staph infection surgery x 3    . Carpal tunnel release  1979    left   . Thyroglossal duct cyst  1985  . Cyst removal on leg  1986  . Kidney stone surgery  2009    laser    Social History   Social History  . Marital Status: Single    Spouse Name: N/A  . Number of Children: N/A  . Years of Education: some colle   Occupational History  . OPerations Manger      [employer]   Social History Main Topics  . Smoking status: Never Smoker   . Smokeless tobacco: Current User     Comment: Snuff/dip  . Alcohol Use: 0.0 oz/week    0 Standard drinks or equivalent per week     Comment: hasnt had anything in 3 weeks; normally has "none to hardly"  . Drug Use: No  . Sexual Activity: Not on file   Other Topics  Concern  . Not on file   Social History Narrative   No regular exercise.    Family History  Problem Relation Age of Onset  . Breast cancer      aunt  . Diabetes      grandfather  . Stroke      uncel   . Diabetes Brother   . Hyperlipidemia Maternal Grandfather   . Heart attack Maternal Grandfather   . Colon cancer Neg Hx   . Other      small bowel cancer    Outpatient Encounter Prescriptions as of 08/25/2015  Medication Sig  . allopurinol (ZYLOPRIM) 100 MG tablet TAKE 2 TABLETS TWICE A DAY.  Marland Kitchen aspirin 81 MG tablet Take 81 mg by mouth daily. Reported on 06/30/2015  . atorvastatin (LIPITOR) 40 MG tablet Take 1 tablet (40 mg total) by mouth daily.  . clotrimazole-betamethasone (LOTRISONE) cream Apply 1 application topically 2 (two) times daily.  . Colchicine (MITIGARE) 0.6 MG CAPS Take 1 capsule by mouth daily.  . valsartan (DIOVAN) 80 MG tablet Take 1 tablet (80 mg total) by mouth daily.  . cyclobenzaprine (FLEXERIL) 10 MG tablet Take 1 tablet (  10 mg total) by mouth 2 (two) times daily as needed for muscle spasms.  . [DISCONTINUED] fluconazole (DIFLUCAN) 150 MG tablet One tab every 3 days.  . [DISCONTINUED] HYDROcodone-acetaminophen (NORCO/VICODIN) 5-325 MG tablet Take one by mouth at bedtime as needed for pain   No facility-administered encounter medications on file as of 08/25/2015.          Objective:   Physical Exam  Constitutional: He is oriented to person, place, and time. He appears well-developed and well-nourished.  HENT:  Head: Normocephalic and atraumatic.  Eyes: Conjunctivae and EOM are normal. Pupils are equal, round, and reactive to light.  Cardiovascular: Normal rate, regular rhythm and normal heart sounds.   Pulmonary/Chest: Effort normal and breath sounds normal.  Musculoskeletal:  Normal cervical flexion extension with rotation right and left and side bending light and rest right and left. Nontender over the cervical spinal paraspinous muscles. Nontender  over the occiput.  Neurological: He is alert and oriented to person, place, and time.  Skin: Skin is warm and dry.  Psychiatric: He has a normal mood and affect. His behavior is normal.          Assessment & Plan:  Right sided occipital headaches triggered by cervical pain and spasm-he also noted has known history of cervical disc disease. Recommend anti-inflammatory as needed as well as heating pad gentle stretches and recommend physical therapy. Handout with some stretches to do on his own until he is able to get into PT. If he is not improving significantly over 3 weeks time with physical therapy then consider further imaging. He did have some x-rays done at the [place].

## 2015-08-26 ENCOUNTER — Encounter: Payer: Self-pay | Admitting: Family Medicine

## 2015-09-05 ENCOUNTER — Encounter: Payer: Self-pay | Admitting: Rehabilitative and Restorative Service Providers"

## 2015-09-05 ENCOUNTER — Ambulatory Visit (INDEPENDENT_AMBULATORY_CARE_PROVIDER_SITE_OTHER): Payer: BLUE CROSS/BLUE SHIELD | Admitting: Rehabilitative and Restorative Service Providers"

## 2015-09-05 DIAGNOSIS — M436 Torticollis: Secondary | ICD-10-CM | POA: Diagnosis not present

## 2015-09-05 DIAGNOSIS — R293 Abnormal posture: Secondary | ICD-10-CM | POA: Diagnosis not present

## 2015-09-05 DIAGNOSIS — G44219 Episodic tension-type headache, not intractable: Secondary | ICD-10-CM

## 2015-09-05 NOTE — Patient Instructions (Signed)
Suboccipital Stretch (Supine)    With small towel roll at base of skull and upper neck. Gently tuck chin until stretch is felt at base of skull and upper neck. Hold __10-15__ seconds. Relax. Repeat __10__ times per set. Do __ 2-3  sessions per day.  Axial Extension (Chin Tuck)    Pull chin in and lengthen back of neck. Hold __10-15__ seconds while counting out loud. Repeat _5-10_ times. Do _several___ sessions per day.   Shoulder Blade Squeeze    Rotate shoulders back, then squeeze shoulder blades down and back. Hold 10 sec Repeat __10 times. Do ___several_ sessions per day. Can use swim noodle along spine to provide tactile cues    Scapula Adduction With Pectoralis Stretch: Low - Standing   Shoulders at 45 hands even with shoulders, keeping weight through legs, shift weight forward until you feel pull or stretch through the front of your chest. Hold _30__ seconds. Do _3__ times, _2-4__ times per day.   Scapula Adduction With Pectoralis Stretch: Mid-Range - Standing   Shoulders at 90 elbows even with shoulders, keeping weight through legs, shift weight forward until you feel pull or strength through the front of your chest. Hold __30_ seconds. Do _3__ times, __2-4_ times per day.   Scapula Adduction With Pectoralis Stretch: High - Standing   Shoulders at 120 hands up high on the doorway, keeping weight on feet, shift weight forward until you feel pull or stretch through the front of your chest. Hold _30__ seconds. Do _3__ times, _2-3__ times per day.
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
## 2015-09-11 ENCOUNTER — Encounter: Payer: Self-pay | Admitting: Rehabilitative and Restorative Service Providers"

## 2015-09-11 ENCOUNTER — Ambulatory Visit (INDEPENDENT_AMBULATORY_CARE_PROVIDER_SITE_OTHER): Payer: BLUE CROSS/BLUE SHIELD | Admitting: Rehabilitative and Restorative Service Providers"

## 2015-09-11 DIAGNOSIS — M436 Torticollis: Secondary | ICD-10-CM

## 2015-09-11 DIAGNOSIS — G44219 Episodic tension-type headache, not intractable: Secondary | ICD-10-CM

## 2015-09-11 DIAGNOSIS — R293 Abnormal posture: Secondary | ICD-10-CM | POA: Diagnosis not present

## 2015-09-11 NOTE — Therapy (Addendum)
[facility] [place], [place], [address] Phone: [phone]   Fax:  [phone]  Physical Therapy Treatment  Patient Details  Name: Jared Harper MRN: [id number] Date of Birth: [birth year] No Data Recorded  Encounter Date: 09/11/2015      PT End of Session - 09/11/15 1106    Visit Number 2   Number of Visits 12   Date for PT Re-Evaluation 10/17/15   PT Start Time 1106   PT Stop Time 1152   PT Time Calculation (min) 46 min   Activity Tolerance Patient tolerated treatment well      Past Medical History  Diagnosis Date  . Hepatitis C antibody test positive     Though has had neg tests as well.   . Hypertension   . Hyperlipidemia   . Gout   . 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 Back pain     Past Surgical History  Procedure Laterality Date  . Staph infection surgery x 3    . Carpal tunnel release  1979    left   . Thyroglossal duct cyst  1985  . Cyst removal on leg  1986  . Kidney stone surgery  2009    laser    There were no vitals filed for this visit.  Visit Diagnosis:  Abnormal posture  Stiffness of cervical spine  Episodic tension-type headache, not intractable      Subjective Assessment - 09/11/15 1107    Subjective Ramere reports that he had one "bad" HA on Tuesday but had a couple others that he treated with tylenol before they got too bad. He is working on his exercises at home and is doing "pretty good"    Currently in Pain? No/denies                         [facility] Adult PT Treatment/Exercise - 09/11/15 0001    Neck Exercises: Machines for Strengthening   UBE (Upper Arm Bike) L2 x 3 min alt fwd/back    Neck Exercises: Standing   Neck Retraction 5 reps;10 secs   Neck Exercises: Supine   Neck Retraction 5 reps;15 secs   Other Supine Exercise snow angel prolonged stretch 2 min    Shoulder Exercises: Standing   Extension Both;20  reps;Theraband   Theraband Level (Shoulder Extension) Level 2 (Red)   Row Both;20 reps;Theraband   Theraband Level (Shoulder Row) Level 2 (Red)   Retraction Strengthening;Both;20 reps;Theraband   Theraband Level (Shoulder Retraction) Level 1 (Yellow)   Shoulder Exercises: Stretch   Corner Stretch --  3 way door 30 sec x 3 each position    Other Shoulder Stretches scap squeeze with noodle 10 sec x 10    Moist Heat Therapy   Number Minutes Moist Heat 15 Minutes   Moist Heat Location Cervical;Shoulder   Electrical Stimulation   Electrical Stimulation Location bilat upper cervical/bilat lower cervical    Electrical Stimulation Action IFC   Electrical Stimulation Parameters to tolerance   Electrical Stimulation Goals Tone  muscular tightness    Manual Therapy   Manual therapy comments pt supine    Myofascial Release posterior cervical musculature; scalp    Passive ROM cervical stretching into flexion and flexioin with slight rotation   Manual Traction cervical                 PT Education - 09/11/15 1120    Education  provided Yes   Education Details HEP   Person(s) Educated Patient   Methods Explanation;Demonstration;Tactile cues;Verbal cues;Handout   Comprehension Verbalized understanding;Returned demonstration;Verbal cues required;Tactile cues required             PT Long Term Goals - 09/11/15 1120    PT LONG TERM GOAL #1   Title Improve cervical and thoracic posture and alignment with pt to demonstrate good upright posture 10/07/15   Time 6   Period Weeks   Status On-going   PT LONG TERM GOAL #2   Title Improve cervical ROM and mobility - increase ROM in limited ranges by 3-5 degrees 10/17/15   Time 6   Period Weeks   Status On-going   PT LONG TERM GOAL #3   Title Rossie reports no more than 1 headache per week at d/c 10/17/15   Time 6   Period Weeks   Status On-going   PT LONG TERM GOAL #4   Title I in HEP 10/17/15   Time 6   Period Weeks   Status  On-going   PT LONG TERM GOAL #5   Title Improve FOTO to </= 13% limitation 10/17/15   Time 6   Period Weeks   Status On-going               Plan - 09/11/15 1143    Clinical Impression Statement Tyri reports good compliance with HEP and demonstrates excellent technique with exercise from HEP in the clinic. Added exercise today without difficulty. Good response to manual work, HEP and postural correction. Progressed well toward stated goals of treatment.     Pt will benefit from skilled therapeutic intervention in order to improve on the following deficits Improper body mechanics;Postural dysfunction;Pain;Increased fascial restricitons;Increased muscle spasms;Decreased mobility;Decreased range of motion;Decreased endurance;Decreased activity tolerance   Rehab Potential Good   PT Frequency 2x / week   PT Duration 6 weeks   PT Treatment/Interventions Patient/family education;ADLs/Self Care Home Management;Manual techniques;Dry needling;Cryotherapy;Electrical Stimulation;Iontophoresis 3m/ml Dexamethasone;Moist Heat;Ultrasound;Neuromuscular re-education   PT Next Visit Plan continue work on posture and alignment; education re proper positioning for dexk/computer/sitting on sofa at home/sleeping; manual work cervical spine/scalp   PT Home Exercise Plan postural correction; HEP    Consulted and Agree with Plan of Care Patient        Problem List Patient Active Problem List   Diagnosis Date Noted  . Gout attack 02/13/2015  . Plantar fasciitis, bilateral 10/02/2014  . Pes cavus 07/29/2014  . IFG (impaired fasting glucose) 06/26/2014  . Atrophy of left kidney 12/20/2013  . Fatty liver 12/20/2013  . Elevated liver enzymes 12/12/2013  . Severe obesity (BMI >= 40) ([place] 12/11/2013  . Hiatal hernia 09/10/2013  . 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 Skin lesion 11/13/2010  . Hyperlipidemia 08/27/2010  . Gout 08/27/2010  . OBESITY  08/27/2010  . HYPERTENSION, BENIGN 08/27/2010  . RENAL CALCULUS, RECURRENT 08/27/2010    Avelina Mcclurkin PNilda SimmerPT, MPH  09/11/2015, 11:47 AM  [facility] [address] [place] [address]Phone: [phone]  Fax:  [phone] Name: RCoburn KnausMRN: [id number] of Birth: [birth year]   PHYSICAL THERAPY DISCHARGE SUMMARY  Visits from Start of Care: 2  Current functional level related to goals / functional outcomes: Good response to initial treatment with decrease pain and improved posture and alignment   Remaining deficits: Continued symptoms    Education / Equipment: HEP  Plan: Patient agrees to discharge.  Patient goals were partially met. Patient is being discharged due to not returning since the last visit.  ?????    Andreal Vultaggio P. Helene Kelp PT, MPH 12/08/2015 7:47 AM

## 2015-09-11 NOTE — Patient Instructions (Addendum)
Resisted External Rotation: in Neutral - Bilateral   PALMS UP Sit or stand, tubing in both hands, elbows at sides, bent to 90, forearms forward. Pinch shoulder blades together and rotate forearms out. Keep elbows at sides. Repeat __10__ times per set. Do _2-3___ sets per session. Do _2-3___ sessions per day.   Low Row: Standing   Face anchor, feet shoulder width apart. Palms up, pull arms back, squeezing shoulder blades together. Repeat 10__ times per set. Do 2-3__ sets per session. Do 2-3__ sessions per week. Anchor Height: Waist   Strengthening: Resisted Extension   Hold tubing in right hand, arm forward. Pull arm back, elbow straight. Repeat _10___ times per set. Do 2-3____ sets per session. Do 2-3____ sessions per day.   Snow angel  Lying on back arms at side  2-5 min  Can bend elbows to release pull for a few seconds then stretch arms back out

## 2015-09-18 ENCOUNTER — Encounter: Payer: BLUE CROSS/BLUE SHIELD | Admitting: Physical Therapy

## 2015-09-25 ENCOUNTER — Encounter: Payer: Self-pay | Admitting: Physical Therapy

## 2015-09-29 ENCOUNTER — Ambulatory Visit: Payer: BLUE CROSS/BLUE SHIELD | Admitting: Family Medicine

## 2015-10-07 DIAGNOSIS — F17201 Nicotine dependence, unspecified, in remission: Secondary | ICD-10-CM | POA: Diagnosis not present

## 2015-10-07 DIAGNOSIS — R079 Chest pain, unspecified: Secondary | ICD-10-CM | POA: Diagnosis not present

## 2015-10-07 DIAGNOSIS — R1013 Epigastric pain: Secondary | ICD-10-CM | POA: Diagnosis not present

## 2015-10-07 DIAGNOSIS — I10 Essential (primary) hypertension: Secondary | ICD-10-CM | POA: Diagnosis not present

## 2015-10-07 DIAGNOSIS — M109 Gout, unspecified: Secondary | ICD-10-CM | POA: Diagnosis not present

## 2015-10-07 DIAGNOSIS — Z7982 Long term (current) use of aspirin: Secondary | ICD-10-CM | POA: Diagnosis not present

## 2015-10-07 DIAGNOSIS — R0789 Other chest pain: Secondary | ICD-10-CM | POA: Diagnosis not present

## 2015-10-07 DIAGNOSIS — R072 Precordial pain: Secondary | ICD-10-CM | POA: Diagnosis not present

## 2015-10-07 DIAGNOSIS — Z79899 Other long term (current) drug therapy: Secondary | ICD-10-CM | POA: Diagnosis not present

## 2015-10-07 LAB — BASIC METABOLIC PANEL
BUN: 20 mg/dL (ref 4–21)
Creatinine: 1.5 mg/dL — AB (ref 0.6–1.3)
GLUCOSE: 128 mg/dL
POTASSIUM: 3.9 mmol/L (ref 3.4–5.3)
Sodium: 143 mmol/L (ref 137–147)

## 2015-10-07 LAB — HEPATIC FUNCTION PANEL
ALT: 56 U/L — AB (ref 10–40)
AST: 38 U/L (ref 14–40)
Alkaline Phosphatase: 85 U/L (ref 25–125)
Bilirubin, Total: 0.8 mg/dL

## 2015-10-07 LAB — CBC AND DIFFERENTIAL
HCT: 43 % (ref 41–53)
Hemoglobin: 14.8 g/dL (ref 13.5–17.5)
PLATELETS: 239 10*3/uL (ref 150–399)
WBC: 8.5 10^3/mL

## 2015-10-09 ENCOUNTER — Ambulatory Visit: Payer: BLUE CROSS/BLUE SHIELD | Admitting: Family Medicine

## 2015-10-17 ENCOUNTER — Encounter: Payer: Self-pay | Admitting: Family Medicine

## 2015-10-20 ENCOUNTER — Ambulatory Visit: Payer: BLUE CROSS/BLUE SHIELD | Admitting: Family Medicine

## 2015-10-20 ENCOUNTER — Ambulatory Visit (INDEPENDENT_AMBULATORY_CARE_PROVIDER_SITE_OTHER): Payer: BLUE CROSS/BLUE SHIELD | Admitting: Family Medicine

## 2015-10-20 ENCOUNTER — Encounter: Payer: Self-pay | Admitting: Family Medicine

## 2015-10-20 VITALS — BP 126/81 | HR 66 | Wt 271.0 lb

## 2015-10-20 DIAGNOSIS — N1832 Chronic kidney disease, stage 3b: Secondary | ICD-10-CM

## 2015-10-20 DIAGNOSIS — R1013 Epigastric pain: Secondary | ICD-10-CM

## 2015-10-20 DIAGNOSIS — I10 Essential (primary) hypertension: Secondary | ICD-10-CM | POA: Diagnosis not present

## 2015-10-20 DIAGNOSIS — N182 Chronic kidney disease, stage 2 (mild): Secondary | ICD-10-CM | POA: Diagnosis not present

## 2015-10-20 DIAGNOSIS — R0789 Other chest pain: Secondary | ICD-10-CM

## 2015-10-20 DIAGNOSIS — N183 Chronic kidney disease, stage 3 (moderate): Secondary | ICD-10-CM | POA: Diagnosis not present

## 2015-10-20 DIAGNOSIS — Z1211 Encounter for screening for malignant neoplasm of colon: Secondary | ICD-10-CM

## 2015-10-20 DIAGNOSIS — R748 Abnormal levels of other serum enzymes: Secondary | ICD-10-CM

## 2015-10-20 LAB — LIPASE: Lipase: 39 U/L (ref 7–60)

## 2015-10-20 LAB — POCT UA - MICROALBUMIN
Albumin/Creatinine Ratio, Urine, POC: <30
CREATININE, POC: 300 mg/dL
Microalbumin Ur, POC: 30 mg/L

## 2015-10-20 NOTE — Progress Notes (Signed)
Subjective:    Patient ID: Jared Harper, male    DOB: [birth year], 50 y.o.   MRN: [id number]  HPI  Patient is here today for hospital follow-up. He is a 50 year old male who [facility] emergency department on April 18 for chest pain. He had complained of chest and abdominal pressure. He thought initially was indigestion and took an antacid but got no relief. His symptoms started after he had gone to sleep that evening. No prior history of coronary artery disease or lung disease. They did lab work and his lipase was mildly elevated as well as his kidney function. Blood count levels were normal. They also did a CT angiogram which was negative. And a chest x-ray which was negative as well. Patient was given Zofran and morphine for pain management and then also given Ativan and hydralazine because of elevated blood pressure levels. His blood pressure improved while he was there in his chest pain resolved.   He has been doing well since then. In the past with had similar symptoms he can usually take a Prilosec or Pepcid and it seemed to work well.  Hypertension- Pt denies chest pain, SOB, dizziness, or heart palpitations.  Taking meds as directed w/o problems.  Denies medication side effects.      Review of Systems BP 126/81 mmHg  Pulse 66  Wt 271 lb (122.925 kg)  SpO2 100%    No Known Allergies  Past Medical History  Diagnosis Date  . Hepatitis C antibody test positive     Though has had neg tests as well.   . Hypertension   . Hyperlipidemia   . Gout   . 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 Back pain     Past Surgical History  Procedure Laterality Date  . Staph infection surgery x 3    . Carpal tunnel release  1979    left   . Thyroglossal duct cyst  1985  . Cyst removal on leg  1986  . Kidney stone surgery  2009    laser    Social History   Social History  . Marital Status: Single    Spouse Name: N/A  .  Number of Children: N/A  . Years of Education: some colle   Occupational History  . OPerations Manger      [employer]   Social History Main Topics  . Smoking status: Never Smoker   . Smokeless tobacco: Current User     Comment: Snuff/dip  . Alcohol Use: 0.0 oz/week    0 Standard drinks or equivalent per week     Comment: hasnt had anything in 3 weeks; normally has "none to hardly"  . Drug Use: No  . Sexual Activity: Not on file   Other Topics Concern  . Not on file   Social History Narrative   No regular exercise.    Family History  Problem Relation Age of Onset  . Breast cancer      aunt  . Diabetes      grandfather  . Stroke      uncel   . Diabetes Brother   . Hyperlipidemia Maternal Grandfather   . Heart attack Maternal Grandfather   . Colon cancer Neg Hx   . Other      small bowel cancer    Outpatient Encounter Prescriptions as of 10/20/2015  Medication Sig  . allopurinol (ZYLOPRIM) 100 MG tablet TAKE 2 TABLETS  TWICE A DAY.  Marland Kitchen aspirin 81 MG tablet Take 81 mg by mouth daily. Reported on 06/30/2015  . atorvastatin (LIPITOR) 40 MG tablet Take 1 tablet (40 mg total) by mouth daily.  . clotrimazole-betamethasone (LOTRISONE) cream Apply 1 application topically 2 (two) times daily.  . Colchicine (MITIGARE) 0.6 MG CAPS Take 1 capsule by mouth daily.  . cyclobenzaprine (FLEXERIL) 10 MG tablet Take 1 tablet (10 mg total) by mouth 2 (two) times daily as needed for muscle spasms.  . valsartan (DIOVAN) 80 MG tablet Take 1 tablet (80 mg total) by mouth daily.   No facility-administered encounter medications on file as of 10/20/2015.        Objective:   Physical Exam  Constitutional: He is oriented to person, place, and time. He appears well-developed and well-nourished.  HENT:  Head: Normocephalic and atraumatic.  Cardiovascular: Normal rate, regular rhythm and normal heart sounds.   Pulmonary/Chest: Effort normal and breath sounds normal.  Neurological: He is  alert and oriented to person, place, and time.  Skin: Skin is warm and dry.  Psychiatric: He has a normal mood and affect. His behavior is normal.          Assessment & Plan:  Epigastric pain and lower chest wall pain-he is doing well and seems to be asymptomatic now. He did rule out for any cardiac episode. He would probably benefit from being on a PPI for at least the next 6 weeks to get symptoms under good control and then wean off as tolerated. Work on diet and exercise. He's gained a little bit of weight over the last few weeks as he admits he's been stress eating. Next  Discussed need for colon cancer screening. We had referred him to [facility] but they were not able to provide the prep that he preferred and there were some questions about billing. He would prefer to be referred to [facility] where his uncle recently went. New order placed.  HTN- Well controlled. Continue current regimen. Follow up in 6 mo.   CKD 3  - recheck urine micro today.

## 2015-11-27 DIAGNOSIS — D125 Benign neoplasm of sigmoid colon: Secondary | ICD-10-CM | POA: Diagnosis not present

## 2015-11-27 DIAGNOSIS — D123 Benign neoplasm of transverse colon: Secondary | ICD-10-CM | POA: Diagnosis not present

## 2015-11-27 DIAGNOSIS — Z1211 Encounter for screening for malignant neoplasm of colon: Secondary | ICD-10-CM | POA: Diagnosis not present

## 2015-11-27 LAB — HM COLONOSCOPY

## 2015-12-11 ENCOUNTER — Encounter: Payer: Self-pay | Admitting: Family Medicine

## 2015-12-24 ENCOUNTER — Encounter: Payer: Self-pay | Admitting: Physician Assistant

## 2015-12-24 ENCOUNTER — Ambulatory Visit (INDEPENDENT_AMBULATORY_CARE_PROVIDER_SITE_OTHER): Payer: BLUE CROSS/BLUE SHIELD | Admitting: Physician Assistant

## 2015-12-24 VITALS — BP 124/90 | HR 75 | Ht 69.0 in | Wt 285.0 lb

## 2015-12-24 DIAGNOSIS — W57XXXA Bitten or stung by nonvenomous insect and other nonvenomous arthropods, initial encounter: Secondary | ICD-10-CM | POA: Diagnosis not present

## 2015-12-24 DIAGNOSIS — T148 Other injury of unspecified body region: Secondary | ICD-10-CM | POA: Diagnosis not present

## 2015-12-24 DIAGNOSIS — L03818 Cellulitis of other sites: Secondary | ICD-10-CM | POA: Diagnosis not present

## 2015-12-24 MED ORDER — DOXYCYCLINE HYCLATE 100 MG PO TABS: 100.0000 mg | ORAL_TABLET | Freq: Two times a day (BID) | ORAL | Status: DC

## 2015-12-24 NOTE — Progress Notes (Signed)
   Subjective:    Patient ID: Jared Harper, male    DOB: 11/13/1965, 50 y.o.   MRN: [id number]  HPI Pt is a 50 yo male who presents to the clinic with 2 tick bites. Pulled tick on about 1 week ago and 4 days ago. Denies any fever, chills, rash, headache, n/v/d. He does have area of warmth and redness around bite. Tender to touch both area where tick bit him on left anterior shin and right flank.    Review of Systems  All other systems reviewed and are negative.      Objective:   Physical Exam  Constitutional: He is oriented to person, place, and time. He appears well-developed and well-nourished.  HENT:  Head: Normocephalic and atraumatic.  Cardiovascular: Normal rate, regular rhythm and normal heart sounds.   Pulmonary/Chest: Effort normal and breath sounds normal.  Neurological: He is alert and oriented to person, place, and time.  Skin:     Psychiatric: He has a normal mood and affect. His behavior is normal.          Assessment & Plan:  Tick bite/cellulitis- pt is having problems with insurance paying for labs. Does not want to get labs drawn. Will treat with doxycycline for 10 days to cover for lymes and treat for cellulitis. Call if not improving. Prevention discussed in office.

## 2015-12-24 NOTE — Patient Instructions (Signed)
Tick Bite Information Ticks are insects that attach themselves to the skin and draw blood for food. There are various types of ticks. Common types include wood ticks and deer ticks. Most ticks live in shrubs and grassy areas. Ticks can climb onto your body when you make contact with leaves or grass where the tick is waiting. The most common places on the body for ticks to attach themselves are the scalp, neck, armpits, waist, and groin. Most tick bites are harmless, but sometimes ticks carry germs that cause diseases. These germs can be spread to a person during the tick's feeding process. The chance of a disease spreading through a tick bite depends on:   The type of tick.  Time of year.   How long the tick is attached.   Geographic location.  HOW CAN YOU PREVENT TICK BITES? Take these steps to help prevent tick bites when you are outdoors:  Wear protective clothing. Long sleeves and long pants are best.   Wear white clothes so you can see ticks more easily.  Tuck your pant legs into your socks.   If walking on a trail, stay in the middle of the trail to avoid brushing against bushes.  Avoid walking through areas with long grass.  Put insect repellent on all exposed skin and along boot tops, pant legs, and sleeve cuffs.   Check clothing, hair, and skin repeatedly and before going inside.   Brush off any ticks that are not attached.  Take a shower or bath as soon as possible after being outdoors.  WHAT IS THE PROPER WAY TO REMOVE A TICK? Ticks should be removed as soon as possible to help prevent diseases caused by tick bites. 1. If latex gloves are available, put them on before trying to remove a tick.  2. Using fine-point tweezers, grasp the tick as close to the skin as possible. You may also use curved forceps or a tick removal tool. Grasp the tick as close to its head as possible. Avoid grasping the tick on its body. 3. Pull gently with steady upward pressure until  the tick lets go. Do not twist the tick or jerk it suddenly. This may break off the tick's head or mouth parts. 4. Do not squeeze or crush the tick's body. This could force disease-carrying fluids from the tick into your body.  5. After the tick is removed, wash the bite area and your hands with soap and water or other disinfectant such as alcohol. 6. Apply a small amount of antiseptic cream or ointment to the bite site.  7. Wash and disinfect any instruments that were used.  Do not try to remove a tick by applying a hot match, petroleum jelly, or fingernail polish to the tick. These methods do not work and may increase the chances of disease being spread from the tick bite.  WHEN SHOULD YOU SEEK MEDICAL CARE? Contact your health care provider if you are unable to remove a tick from your skin or if a part of the tick breaks off and is stuck in the skin.  After a tick bite, you need to be aware of signs and symptoms that could be related to diseases spread by ticks. Contact your health care provider if you develop any of the following in the days or weeks after the tick bite:  Unexplained fever.  Rash. A circular rash that appears days or weeks after the tick bite may indicate the possibility of Lyme disease. The rash may resemble   a target with a bull's-eye and may occur at a different part of your body than the tick bite.  Redness and swelling in the area of the tick bite.   Tender, swollen lymph glands.   Diarrhea.   Weight loss.   Cough.   Fatigue.   Muscle, joint, or bone pain.   Abdominal pain.   Headache.   Lethargy or a change in your level of consciousness.  Difficulty walking or moving your legs.   Numbness in the legs.   Paralysis.  Shortness of breath.   Confusion.   Repeated vomiting.    This information is not intended to replace advice given to you by your health care provider. Make sure you discuss any questions you have with your health  care provider.   Document Released: 06/04/2000 Document Revised: 06/28/2014 Document Reviewed: 11/15/2012 Elsevier Interactive Patient Education 2016 [employer]  

## 2016-01-19 DIAGNOSIS — Z23 Encounter for immunization: Secondary | ICD-10-CM | POA: Diagnosis not present

## 2016-01-19 DIAGNOSIS — I10 Essential (primary) hypertension: Secondary | ICD-10-CM | POA: Diagnosis not present

## 2016-01-19 DIAGNOSIS — E785 Hyperlipidemia, unspecified: Secondary | ICD-10-CM | POA: Diagnosis not present

## 2016-01-31 ENCOUNTER — Other Ambulatory Visit: Payer: Self-pay | Admitting: Physician Assistant

## 2016-02-03 ENCOUNTER — Telehealth: Payer: Self-pay | Admitting: *Deleted

## 2016-02-11 NOTE — Telephone Encounter (Signed)
Error.  Closed.

## 2016-02-19 DIAGNOSIS — Z125 Encounter for screening for malignant neoplasm of prostate: Secondary | ICD-10-CM | POA: Diagnosis not present

## 2016-02-19 DIAGNOSIS — M109 Gout, unspecified: Secondary | ICD-10-CM | POA: Diagnosis not present

## 2016-02-19 LAB — HEPATIC FUNCTION PANEL
ALT: 90 U/L — AB (ref 10–40)
AST: 35 U/L (ref 14–40)
Alkaline Phosphatase: 90 U/L (ref 25–125)
BILIRUBIN, TOTAL: 0.6 mg/dL
Bilirubin, Direct: 0.2 mg/dL

## 2016-02-19 LAB — PSA: Prostate Specific Ag, Serum: 0.435

## 2016-02-19 LAB — LIPID PANEL
Cholesterol: 161 mg/dL (ref 0–200)
HDL: 43 mg/dL (ref 35–70)
TRIGLYCERIDES: 272 mg/dL — AB (ref 40–160)

## 2016-03-02 ENCOUNTER — Ambulatory Visit: Payer: BLUE CROSS/BLUE SHIELD | Admitting: Family Medicine

## 2016-03-08 ENCOUNTER — Ambulatory Visit: Payer: BLUE CROSS/BLUE SHIELD | Admitting: Family Medicine

## 2016-03-08 NOTE — Progress Notes (Deleted)
Subjective:    CC: HTN, IFG  HPI:  Hypertension- Pt denies chest pain, SOB, dizziness, or heart palpitations.  Taking meds as directed w/o problems.  Denies medication side effects.    IFG -   Past medical history, Surgical history, Family history not pertinant except as noted below, Social history, Allergies, and medications have been entered into the medical record, reviewed, and corrections made.   Review of Systems: No fevers, chills, night sweats, weight loss, chest pain, or shortness of breath.   Objective:    General: Well Developed, well nourished, and in no acute distress.  Neuro: Alert and oriented x3, extra-ocular muscles intact, sensation grossly intact.  HEENT: Normocephalic, atraumatic  Skin: Warm and dry, no rashes. Cardiac: Regular rate and rhythm, no murmurs rubs or gallops, no lower extremity edema.  Respiratory: Clear to auscultation bilaterally. Not using accessory muscles, speaking in full sentences.   Impression and Recommendations:    HTN -   IFG -

## 2016-03-15 ENCOUNTER — Other Ambulatory Visit: Payer: Self-pay | Admitting: Family Medicine

## 2016-03-15 DIAGNOSIS — I10 Essential (primary) hypertension: Secondary | ICD-10-CM

## 2016-03-22 DIAGNOSIS — H524 Presbyopia: Secondary | ICD-10-CM | POA: Diagnosis not present

## 2016-03-22 DIAGNOSIS — H527 Unspecified disorder of refraction: Secondary | ICD-10-CM | POA: Diagnosis not present

## 2016-04-08 DIAGNOSIS — H903 Sensorineural hearing loss, bilateral: Secondary | ICD-10-CM | POA: Diagnosis not present

## 2016-06-07 ENCOUNTER — Other Ambulatory Visit: Payer: Self-pay | Admitting: Family Medicine

## 2016-06-22 ENCOUNTER — Other Ambulatory Visit: Payer: Self-pay | Admitting: Family Medicine

## 2016-06-22 DIAGNOSIS — I10 Essential (primary) hypertension: Secondary | ICD-10-CM

## 2016-07-15 IMAGING — US US ABDOMEN COMPLETE
1 series · 13 of 25 positions shown · non-contrast
Comparison: Abdominal ultrasound December 20, 2013

CLINICAL DATA: Elevated liver enzymes and cm creatinine. History of
hepatitis-C

EXAM:
ULTRASOUND ABDOMEN COMPLETE

[Series 1: us abdomen complete · 0.20mm/px · 13 of 151 slices shown]
[im 1/151]
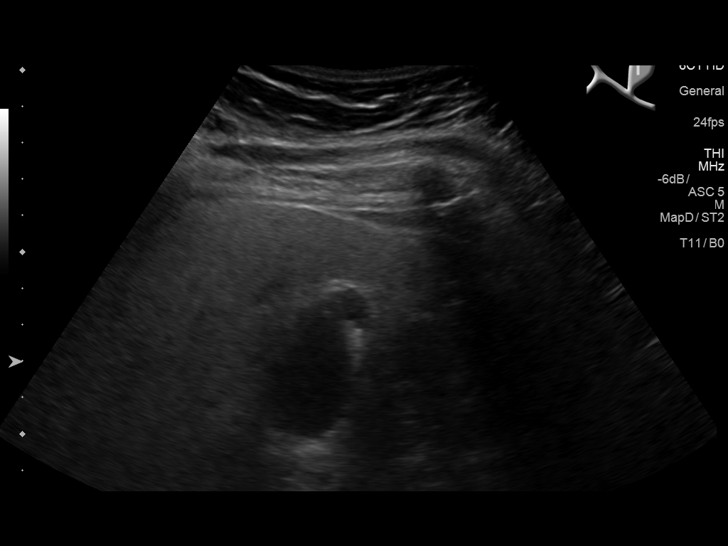
[im 13/151]
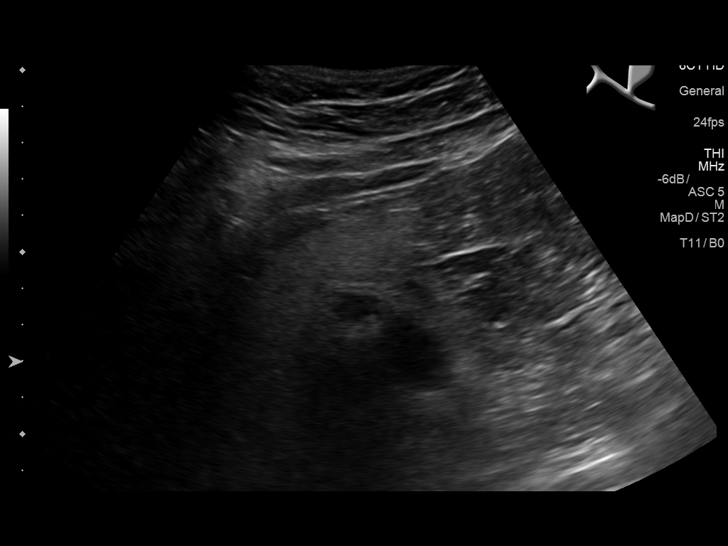
[im 26/151]
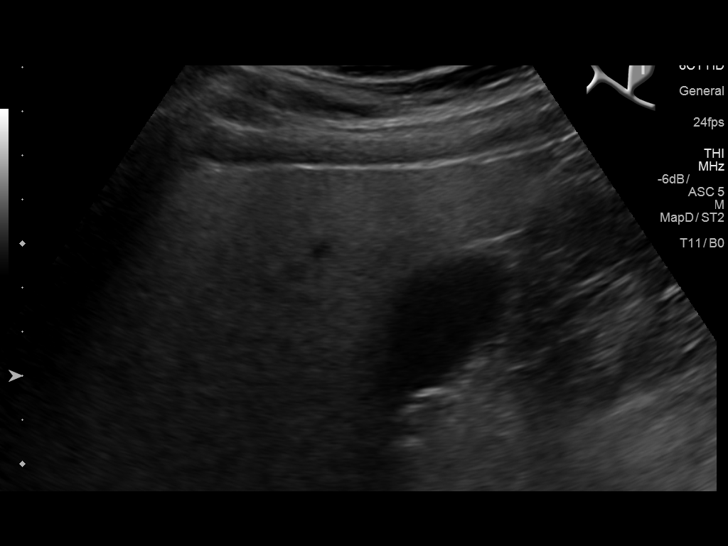
[im 38/151]
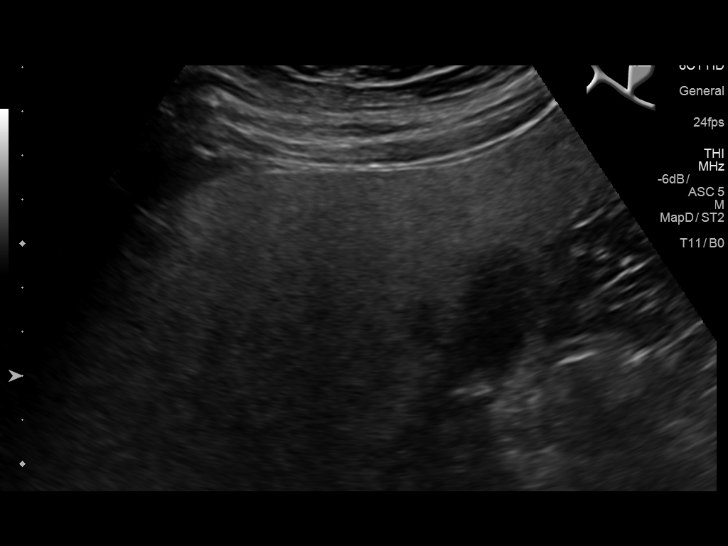
[im 51/151]
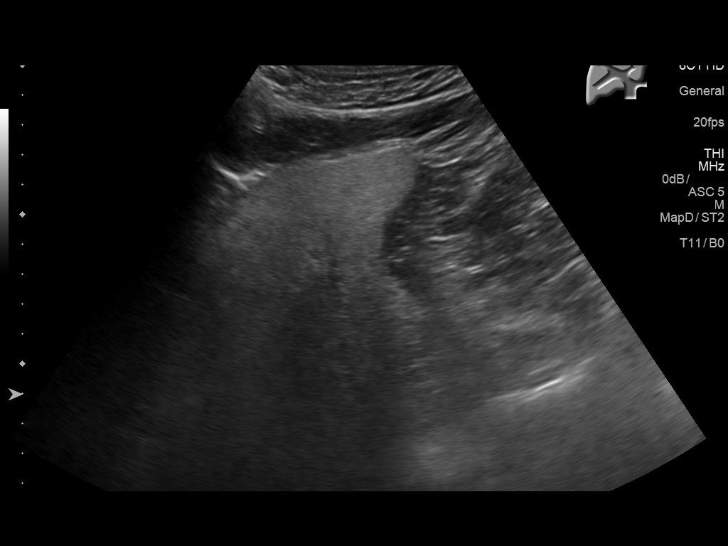
[im 63/151]
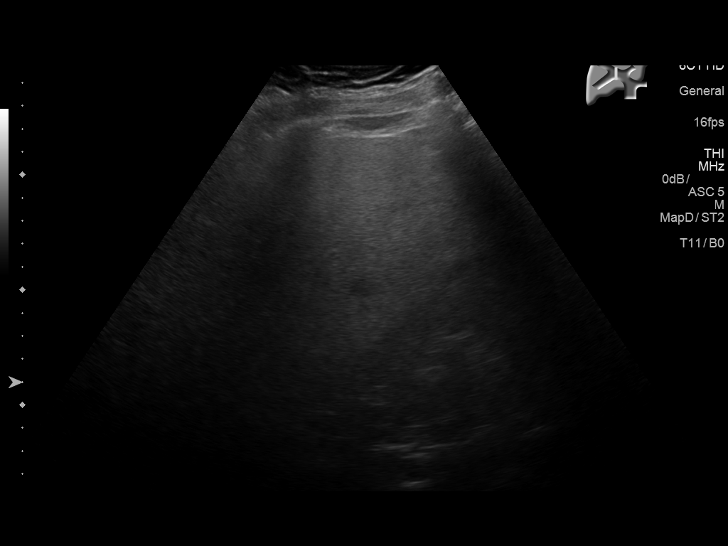
[im 76/151]
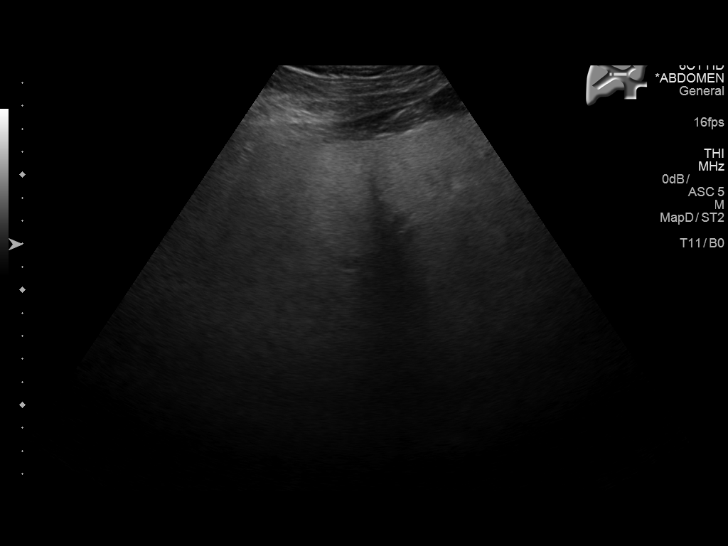
[im 88/151]
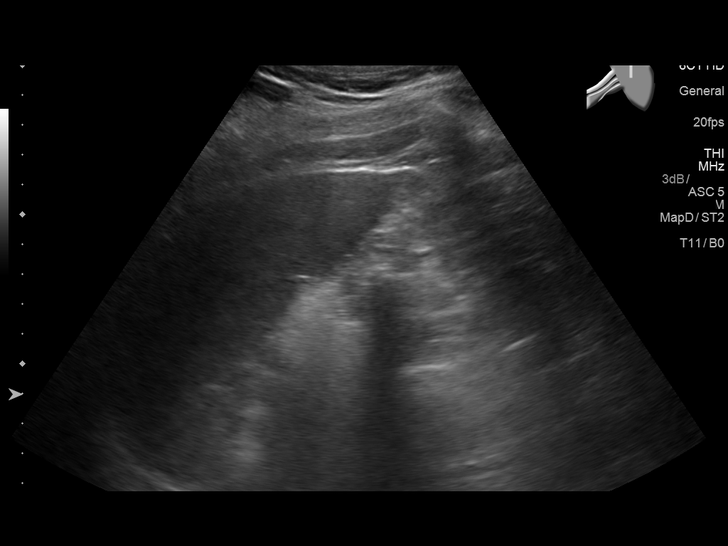
[im 101/151]
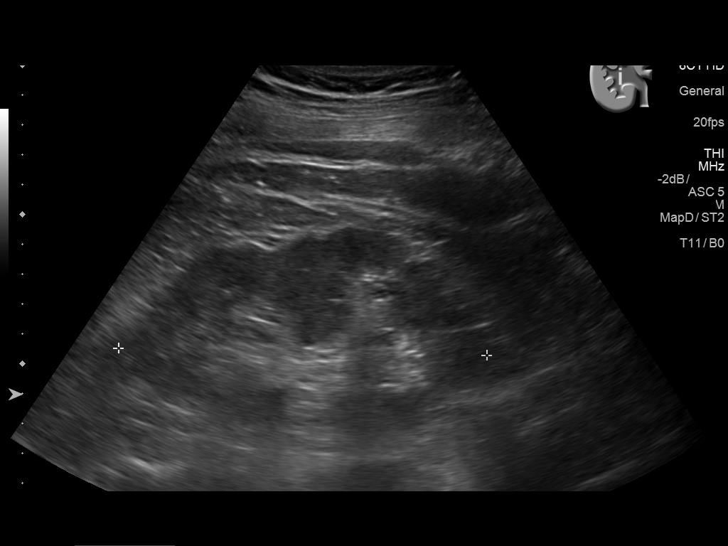
[im 113/151]
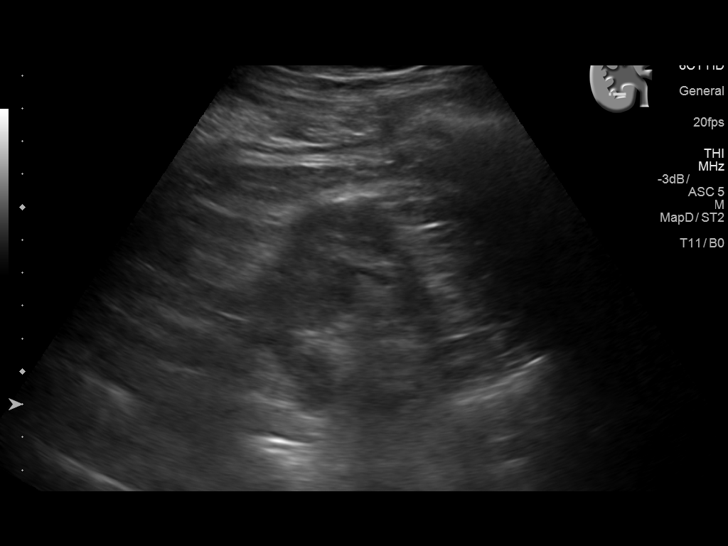
[im 126/151]
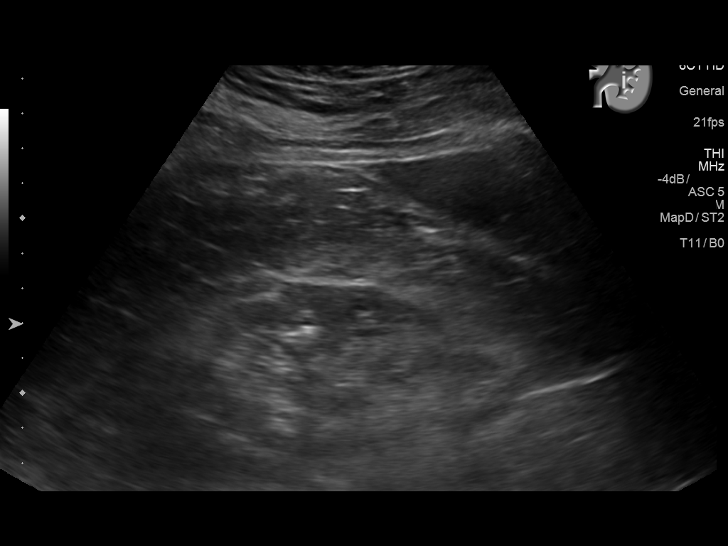
[im 138/151]
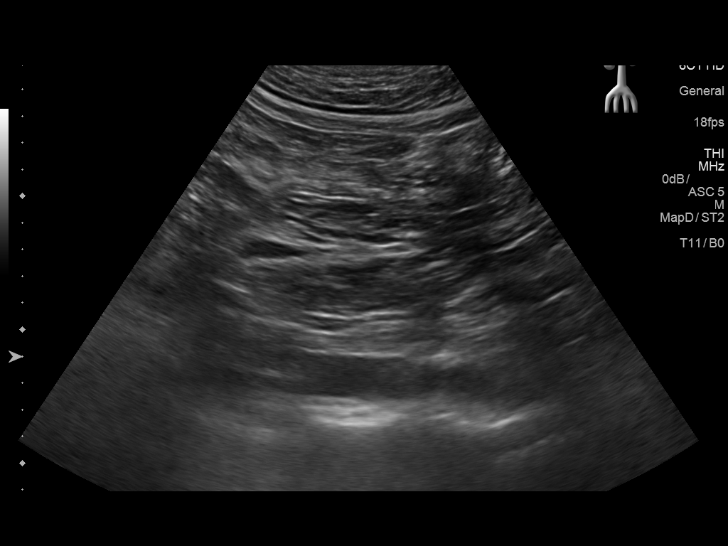
[im 151/151]
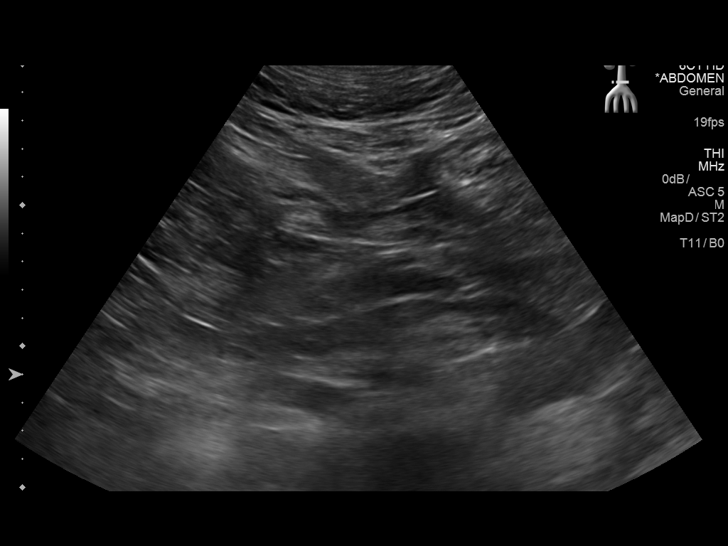

[13 of 25 positions shown; findings below may reference images not displayed]

FINDINGS: Gallbladder: Within the gallbladder, there are multiple echogenic
foci which move and shadow consistent with gallstones. Largest
gallstone measures 9 mm in length. There is no gallbladder wall
thickening or pericholecystic fluid. No sonographic Murphy sign
noted.

Common bile duct: Diameter: 3 mm. There is no intrahepatic, common
hepatic, or common bile duct dilatation.

Liver: No focal lesion identified. Liver echogenicity is diffusely
increased.

IVC: No abnormality visualized. Portions of the inferior vena cava
are obscured by gas.

Pancreas: Pancreas is essentially completely obscured by gas.

Spleen: Size and appearance within normal limits.

Right Kidney: Length: 12.3 cm. Echogenicity and renal cortical
thickness are within normal limits. No mass or hydronephrosis
visualized.

Left Kidney: Length: 9.8 cm. Echogenicity is mildly increased. There
is renal cortical thinning diffusely. No mass or hydronephrosis
visualized.

Abdominal aorta: No aneurysm visualized.

Other findings: No demonstrable ascites.
IMPRESSION: Cholelithiasis.

Diffuse increase in liver echogenicity, a finding most likely due to
hepatic steatosis. Underlying parenchymal liver disease may also be
contributory. While no focal liver lesions are identified, it must
be cautioned that the sensitivity of ultrasound for focal liver
lesions is diminished significantly in this circumstance.

Evidence of a degree of atrophy in the left kidney. This finding was
noted previously. Right kidney appears unremarkable.

Much of the inferior vena cava and essentially all of the pancreas
are obscured by gas.

## 2016-07-21 DIAGNOSIS — Z23 Encounter for immunization: Secondary | ICD-10-CM | POA: Diagnosis not present

## 2016-07-21 DIAGNOSIS — R079 Chest pain, unspecified: Secondary | ICD-10-CM | POA: Diagnosis not present

## 2016-07-21 DIAGNOSIS — I10 Essential (primary) hypertension: Secondary | ICD-10-CM | POA: Diagnosis not present

## 2016-07-21 DIAGNOSIS — Z13818 Encounter for screening for other digestive system disorders: Secondary | ICD-10-CM | POA: Diagnosis not present

## 2016-07-21 DIAGNOSIS — Z114 Encounter for screening for human immunodeficiency virus [HIV]: Secondary | ICD-10-CM | POA: Diagnosis not present

## 2016-07-21 DIAGNOSIS — Z0389 Encounter for observation for other suspected diseases and conditions ruled out: Secondary | ICD-10-CM | POA: Diagnosis not present

## 2016-07-21 DIAGNOSIS — E785 Hyperlipidemia, unspecified: Secondary | ICD-10-CM | POA: Diagnosis not present

## 2016-07-21 LAB — CALCIUM: CALCIUM: 9.5 mg/dL

## 2016-07-21 LAB — LIPID PANEL
CHOLESTEROL: 155 mg/dL (ref 0–200)
HDL: 39 mg/dL (ref 35–70)
LDL Cholesterol: 72 mg/dL
TRIGLYCERIDES: 221 mg/dL — AB (ref 40–160)

## 2016-07-21 LAB — HEMOGLOBIN A1C: HEMOGLOBIN A1C: 6

## 2016-07-21 LAB — BASIC METABOLIC PANEL
BUN: 14 mg/dL (ref 4–21)
CREATININE: 1.3 mg/dL (ref 0.6–1.3)
Creatinine: 1.3 mg/dL (ref 0.6–1.3)
Glucose: 118 mg/dL
POTASSIUM: 4.2 mmol/L (ref 3.4–5.3)
POTASSIUM: 4.2 mmol/L (ref 3.4–5.3)
SODIUM: 142 mmol/L (ref 137–147)
SODIUM: 142 mmol/L (ref 137–147)

## 2016-07-21 LAB — URIC ACID: URIC ACID: 6.4

## 2016-07-21 LAB — HEPATIC FUNCTION PANEL
ALT: 79 U/L — AB (ref 10–40)
AST: 35 U/L (ref 14–40)
Alkaline Phosphatase: 91 U/L (ref 25–125)
BILIRUBIN, TOTAL: 0.8 mg/dL
Bilirubin, Direct: 0.2 mg/dL

## 2016-07-21 LAB — CBC AND DIFFERENTIAL
HEMATOCRIT: 43 % (ref 41–53)
Hemoglobin: 15.5 g/dL (ref 13.5–17.5)
Platelets: 271 10*3/uL (ref 150–399)
WBC: 6 10^3/mL

## 2016-08-11 ENCOUNTER — Ambulatory Visit: Payer: BLUE CROSS/BLUE SHIELD | Admitting: Family Medicine

## 2016-08-12 ENCOUNTER — Ambulatory Visit (INDEPENDENT_AMBULATORY_CARE_PROVIDER_SITE_OTHER): Payer: BLUE CROSS/BLUE SHIELD | Admitting: Family Medicine

## 2016-08-12 ENCOUNTER — Ambulatory Visit: Payer: BLUE CROSS/BLUE SHIELD | Admitting: Family Medicine

## 2016-08-12 ENCOUNTER — Encounter: Payer: Self-pay | Admitting: Family Medicine

## 2016-08-12 VITALS — BP 112/80 | HR 103 | Wt 290.0 lb

## 2016-08-12 DIAGNOSIS — R079 Chest pain, unspecified: Secondary | ICD-10-CM | POA: Diagnosis not present

## 2016-08-12 DIAGNOSIS — F079 Unspecified personality and behavioral disorder due to known physiological condition: Secondary | ICD-10-CM | POA: Diagnosis not present

## 2016-08-12 DIAGNOSIS — M10362 Gout due to renal impairment, left knee: Secondary | ICD-10-CM | POA: Diagnosis not present

## 2016-08-12 MED ORDER — COLCHICINE 0.6 MG PO TABS: 0.6000 mg | ORAL_TABLET | Freq: Every day | ORAL | 1 refills | Status: DC

## 2016-08-12 NOTE — Progress Notes (Signed)
Jared Harper is a 51 y.o. male who presents to [place] today for left ankle pain.   The patient notes ankle pain and swelling consistent with gout starting about 5 days ago. He's been taking colchicine now for 3 days and notes continued pain and swelling. Symptoms are consistent with gout without fevers chills nausea vomiting or diarrhea. He denies any injury. He notes that he currently takes 200 mg of allopurinol daily and colchicine intermittently for gout. Gout is typically managed by his primary care provider at the [facility]. He notes he's had creatinine and uric acid levels checked recently.   Past Medical History:  Diagnosis Date  . Back pain   . Gout   . Hepatitis C antibody test positive    Though has had neg tests as well.   . Hyperlipidemia   . Hypertension   . 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 CARPAL TUNNEL RELEASE  1979   left   . cyst removal on leg  1986  . KIDNEY STONE SURGERY  2009   laser  . staph infection surgery x 3    . THYROGLOSSAL DUCT CYST  1985   Social History  Substance Use Topics  . Smoking status: Never Smoker  . Smokeless tobacco: Current User     Comment: Snuff/dip  . Alcohol use 0.0 oz/week     Comment: hasnt had anything in 3 weeks; normally has "none to hardly"     ROS:  As above   Medications: Current Outpatient Prescriptions  Medication Sig Dispense Refill  . allopurinol (ZYLOPRIM) 100 MG tablet TAKE 2 TABLETS TWICE A DAY. 360 tablet 1  . aspirin 81 MG tablet Take 81 mg by mouth daily. Reported on 06/30/2015    . atorvastatin (LIPITOR) 40 MG tablet Take 1 tablet (40 mg total) by mouth daily. APPOINTMENT NEEDED FOR FURTHER REFILLS 90 tablet 1  . clotrimazole-betamethasone (LOTRISONE) cream Apply 1 application topically 2 (two) times daily. 60 g 0  . colchicine 0.6 MG tablet Take 1 tablet (0.6 mg  total) by mouth daily. 30 tablet 1  . colchicine 0.6 MG tablet Take 1 tablet (0.6 mg total) by mouth daily. Need to check uric acid level 30 tablet 1  . cyclobenzaprine (FLEXERIL) 10 MG tablet Take 1 tablet (10 mg total) by mouth 2 (two) times daily as needed for muscle spasms. 30 tablet 0  . valsartan (DIOVAN) 80 MG tablet Take 1 tablet (80 mg total) by mouth daily. *APPOINTMENT REQUIRED FOR FUTURE REFILLS. PLEASE CALL OFFICE TO SCHEDULE AN APPOINTMENT* 90 tablet 0   No current facility-administered medications for this visit.    No Known Allergies   Exam:  BP 112/80   Pulse (!) 103   Wt 290 lb (131.5 kg)   BMI 42.83 kg/m  General: Well Developed, well nourished, and in no acute distress.  Neuro/Psych: Alert and oriented x3, extra-ocular muscles intact, able to move all 4 extremities, sensation grossly intact. Skin: Warm and dry, no rashes noted.  Respiratory: Not using accessory muscles, speaking in full sentences, trachea midline.  Cardiovascular: Pulses palpable, no extremity edema. Abdomen: Does not appear distended. MSK: Left ankle effusion. Tender to palpation especially laterally. Normal motion. Pulses capillary refill and sensation intact distally.  Procedure: Real-time Ultrasound Guided Injection of left lateral ankle effusion  Device: GE Logiq E  Images permanently stored and available for review in  the ultrasound unit. Verbal informed consent obtained. Discussed risks and benefits of procedure. Warned about infection bleeding damage to structures skin hypopigmentation and fat atrophy among others. Patient expresses understanding and agreement Time-out conducted.  Noted no overlying erythema, induration, or other signs of local infection.  Skin prepped in a sterile fashion.  Local anesthesia: Topical Ethyl chloride.  With sterile technique and under real time ultrasound guidance: 40 mg of Kenalog and 2 mL of Marcaine injected easily.  Completed without difficulty   Pain immediately resolved suggesting accurate placement of the medication.  Advised to call if fevers/chills, erythema, induration, drainage, or persistent bleeding.  Images permanently stored and available for review in the ultrasound unit.  Impression: Technically successful ultrasound guided injection.      No results found for this or any previous visit (from the past 48 hour(s)). No results found.    Assessment and Plan: 51 y.o. male with recurrent gout. Patient has had multiple episodes of gout flares recently. Injected today and will treat with longer course of colchicine. Patient will try to obtain recent laboratory results from the [place] to see if he would benefit from adjusting allopurinol dose to better control gout. I don't think he is under good control.   No orders of the defined types were placed in this encounter.   Discussed warning signs or symptoms. Please see discharge instructions. Patient expresses understanding.

## 2016-08-12 NOTE — Patient Instructions (Signed)
Thank you for coming in today. Take colchicine as needed.  Send me recent labs results including Uric Acid and creatinine and BUN.   You can fax to [phone] attn Dr Lynne Leader  Call or go to the ER if you develop a large red swollen joint with extreme pain or oozing puss.

## 2016-08-17 ENCOUNTER — Encounter: Payer: Self-pay | Admitting: Family Medicine

## 2016-08-23 LAB — CALCIUM: Calcium: 9.5 mg/dL

## 2016-09-21 ENCOUNTER — Other Ambulatory Visit: Payer: Self-pay | Admitting: Family Medicine

## 2016-09-21 DIAGNOSIS — I10 Essential (primary) hypertension: Secondary | ICD-10-CM

## 2016-09-27 IMAGING — CR DG FOOT COMPLETE 3+V*R*
3 series · 3 of 3 positions shown · non-contrast
Comparison: None.

CLINICAL DATA: Right foot pain after lifting a large tire 2 days
ago. Pain is posterior near the heel. Initial encounter.

EXAM:
RIGHT FOOT COMPLETE - 3+ VIEW

[foot ap]
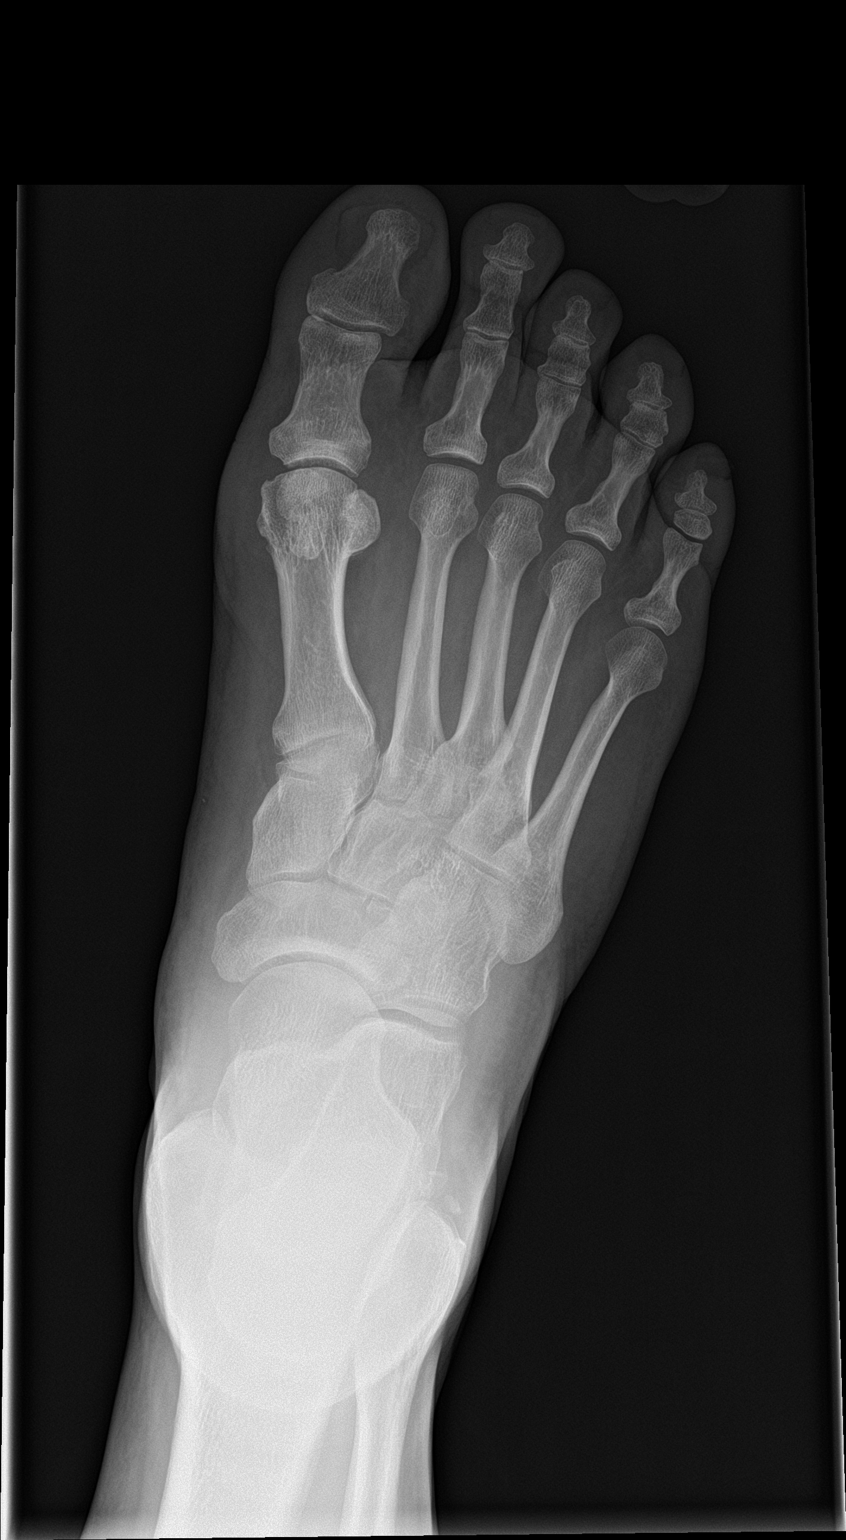

[foot obl]
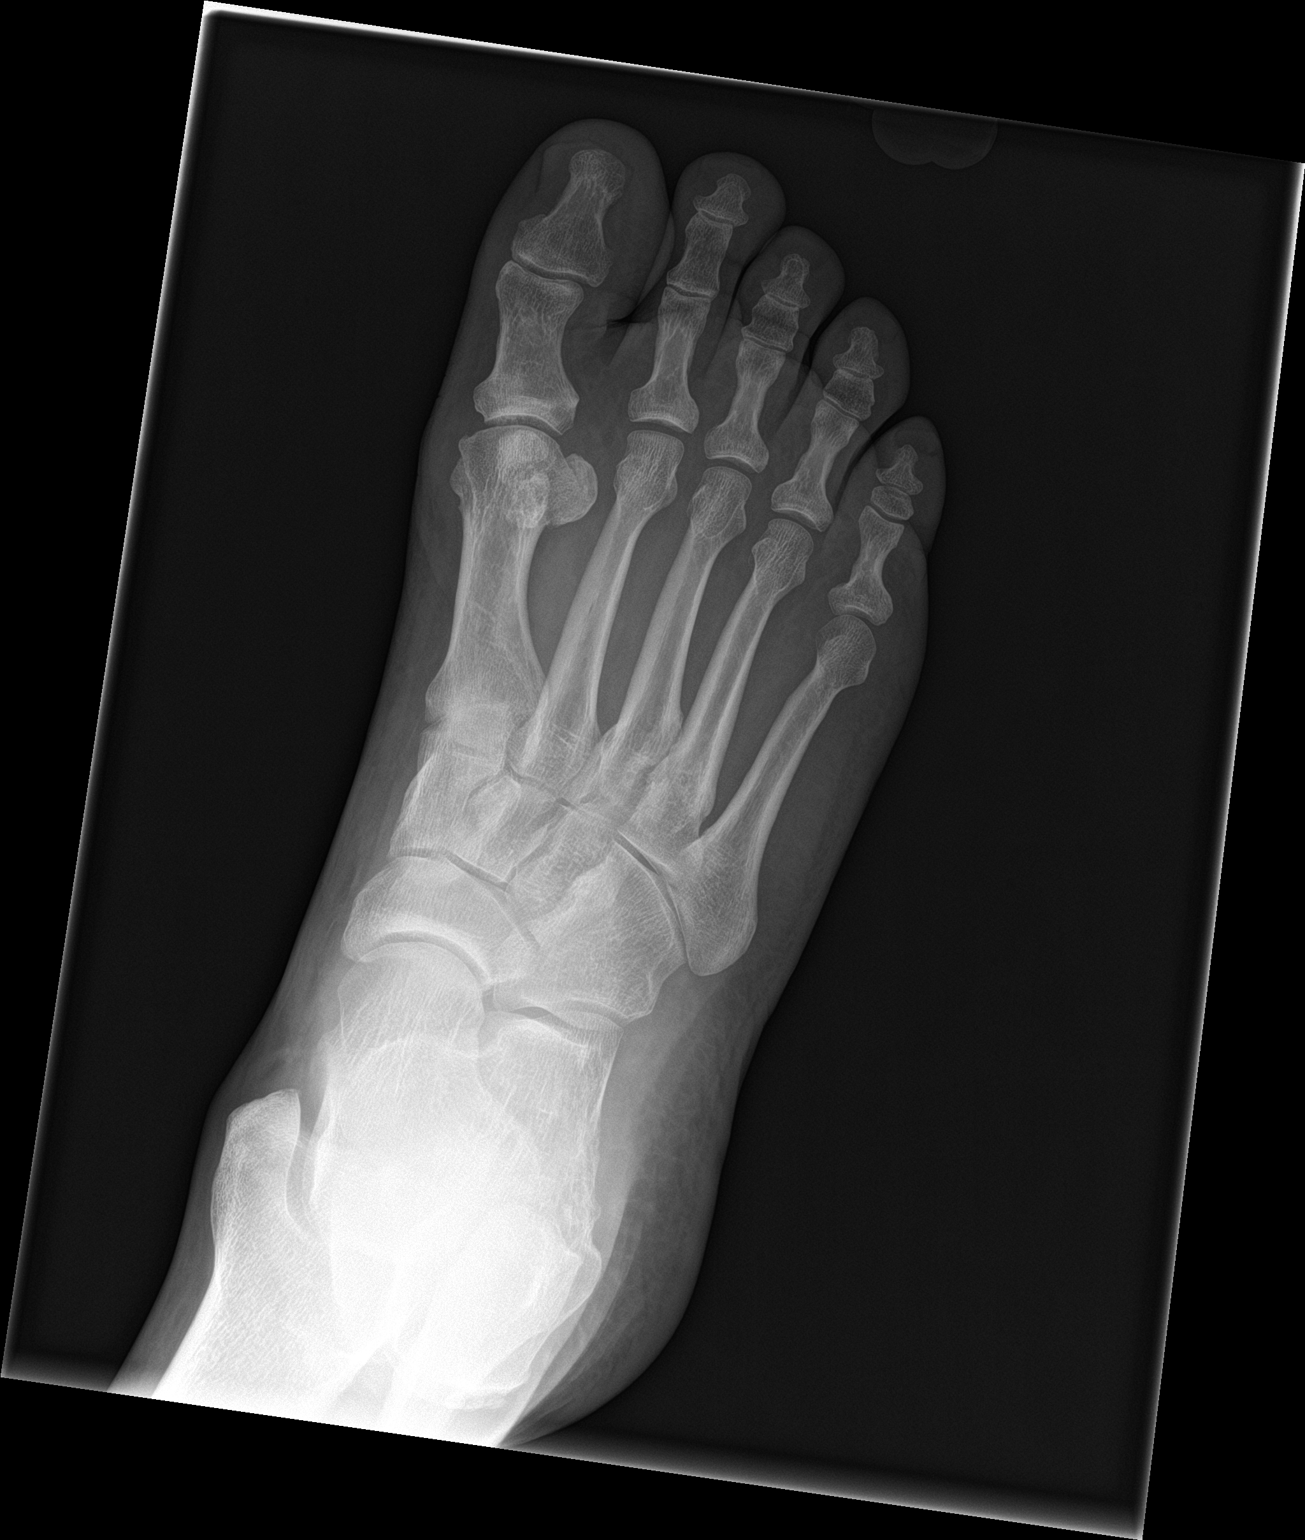

[foot lat]
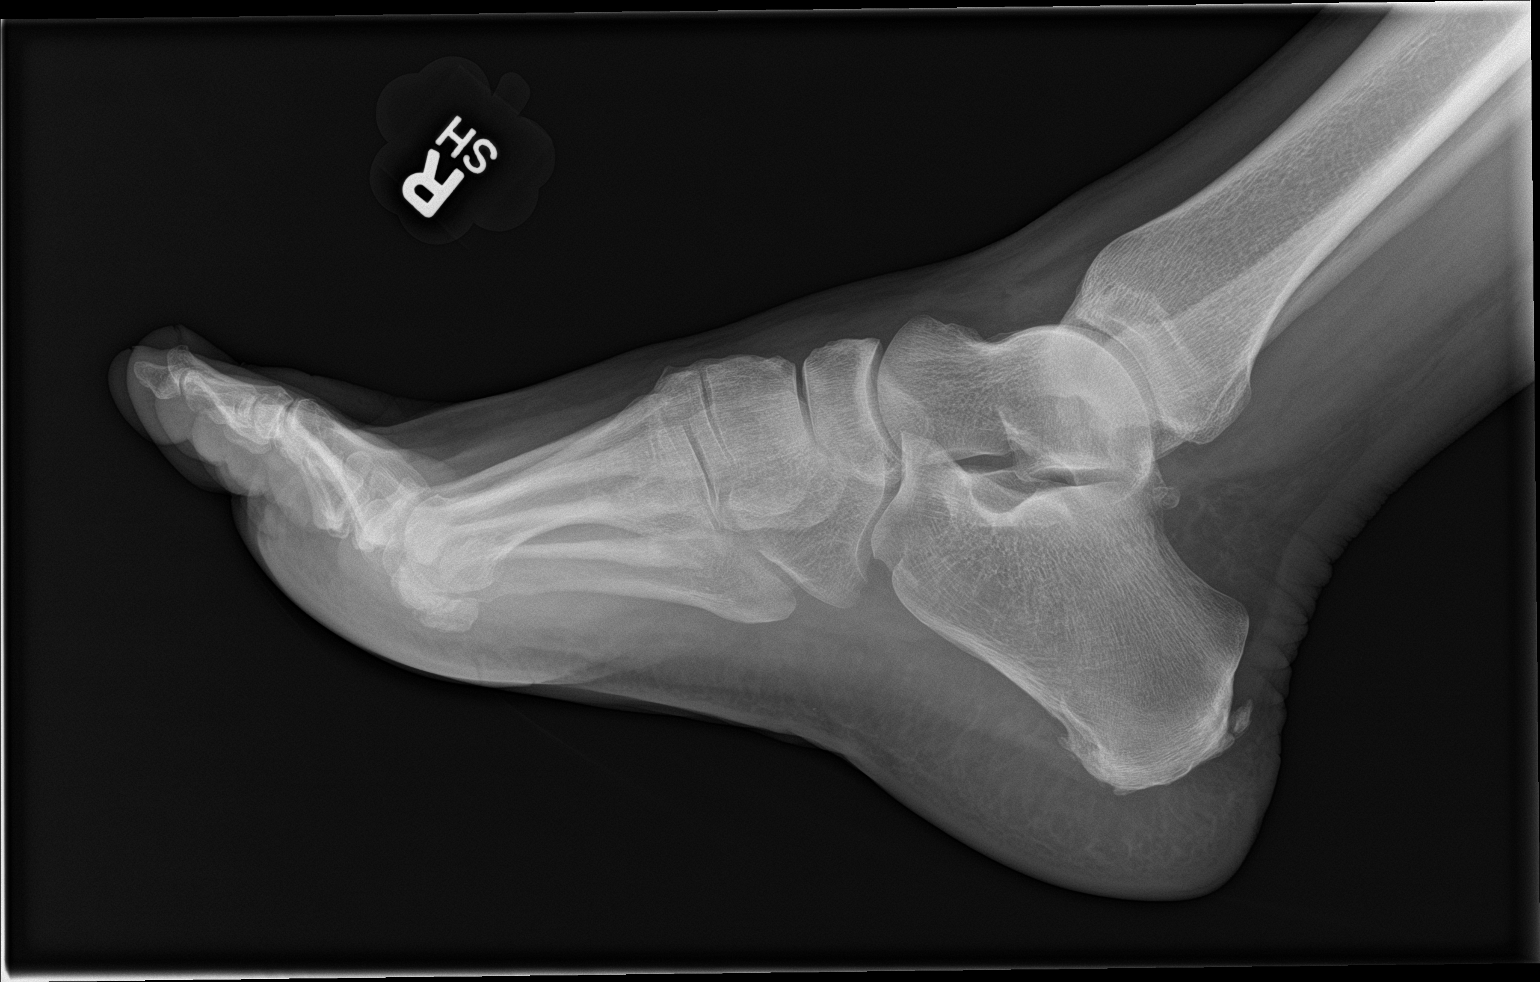

[3 of 3 positions shown; findings below may reference images not displayed]

FINDINGS: No acute fracture or dislocation is identified. Posterior and
plantar calcaneal enthesophytes are noted. A small well corticated
ossicle projects just distal to the lateral malleolus, possibly
remote trauma. No lytic or blastic osseous lesion or soft tissue
abnormality is seen.
IMPRESSION: Calcaneal enthesophytes.  No acute osseous abnormality identified.

## 2016-11-25 DIAGNOSIS — I10 Essential (primary) hypertension: Secondary | ICD-10-CM | POA: Diagnosis not present

## 2016-11-25 DIAGNOSIS — M109 Gout, unspecified: Secondary | ICD-10-CM | POA: Diagnosis not present

## 2016-11-25 DIAGNOSIS — L989 Disorder of the skin and subcutaneous tissue, unspecified: Secondary | ICD-10-CM | POA: Diagnosis not present

## 2016-11-25 DIAGNOSIS — Z0189 Encounter for other specified special examinations: Secondary | ICD-10-CM | POA: Diagnosis not present

## 2016-11-25 DIAGNOSIS — E785 Hyperlipidemia, unspecified: Secondary | ICD-10-CM | POA: Diagnosis not present

## 2017-01-19 DIAGNOSIS — I10 Essential (primary) hypertension: Secondary | ICD-10-CM | POA: Diagnosis not present

## 2017-01-19 DIAGNOSIS — M109 Gout, unspecified: Secondary | ICD-10-CM | POA: Diagnosis not present

## 2017-01-19 DIAGNOSIS — E785 Hyperlipidemia, unspecified: Secondary | ICD-10-CM | POA: Diagnosis not present

## 2017-01-19 DIAGNOSIS — K219 Gastro-esophageal reflux disease without esophagitis: Secondary | ICD-10-CM | POA: Diagnosis not present

## 2017-03-02 DIAGNOSIS — Z87891 Personal history of nicotine dependence: Secondary | ICD-10-CM | POA: Diagnosis not present

## 2017-03-02 DIAGNOSIS — R234 Changes in skin texture: Secondary | ICD-10-CM | POA: Diagnosis not present

## 2017-03-02 DIAGNOSIS — L821 Other seborrheic keratosis: Secondary | ICD-10-CM | POA: Diagnosis not present

## 2017-03-04 DIAGNOSIS — L918 Other hypertrophic disorders of the skin: Secondary | ICD-10-CM | POA: Diagnosis not present

## 2017-04-05 DIAGNOSIS — Z48817 Encounter for surgical aftercare following surgery on the skin and subcutaneous tissue: Secondary | ICD-10-CM | POA: Diagnosis not present

## 2017-04-29 DIAGNOSIS — K219 Gastro-esophageal reflux disease without esophagitis: Secondary | ICD-10-CM | POA: Diagnosis not present

## 2017-04-29 DIAGNOSIS — R0782 Intercostal pain: Secondary | ICD-10-CM | POA: Diagnosis not present

## 2017-05-20 DIAGNOSIS — K760 Fatty (change of) liver, not elsewhere classified: Secondary | ICD-10-CM | POA: Diagnosis not present

## 2017-05-20 DIAGNOSIS — M205X1 Other deformities of toe(s) (acquired), right foot: Secondary | ICD-10-CM | POA: Diagnosis not present

## 2017-05-20 DIAGNOSIS — M7732 Calcaneal spur, left foot: Secondary | ICD-10-CM | POA: Diagnosis not present

## 2017-05-20 DIAGNOSIS — M205X2 Other deformities of toe(s) (acquired), left foot: Secondary | ICD-10-CM | POA: Diagnosis not present

## 2017-05-20 DIAGNOSIS — K8050 Calculus of bile duct without cholangitis or cholecystitis without obstruction: Secondary | ICD-10-CM | POA: Diagnosis not present

## 2017-05-20 DIAGNOSIS — M7731 Calcaneal spur, right foot: Secondary | ICD-10-CM | POA: Diagnosis not present

## 2017-05-20 DIAGNOSIS — N200 Calculus of kidney: Secondary | ICD-10-CM | POA: Diagnosis not present

## 2017-06-08 DIAGNOSIS — N183 Chronic kidney disease, stage 3 (moderate): Secondary | ICD-10-CM | POA: Diagnosis not present

## 2017-06-08 DIAGNOSIS — E1122 Type 2 diabetes mellitus with diabetic chronic kidney disease: Secondary | ICD-10-CM | POA: Diagnosis not present

## 2017-06-08 DIAGNOSIS — I129 Hypertensive chronic kidney disease with stage 1 through stage 4 chronic kidney disease, or unspecified chronic kidney disease: Secondary | ICD-10-CM | POA: Diagnosis not present

## 2017-06-08 DIAGNOSIS — Z87442 Personal history of urinary calculi: Secondary | ICD-10-CM | POA: Diagnosis not present

## 2017-06-15 DIAGNOSIS — Z7189 Other specified counseling: Secondary | ICD-10-CM | POA: Diagnosis not present

## 2017-06-15 DIAGNOSIS — N200 Calculus of kidney: Secondary | ICD-10-CM | POA: Diagnosis not present

## 2017-06-22 DIAGNOSIS — N200 Calculus of kidney: Secondary | ICD-10-CM | POA: Diagnosis not present

## 2017-06-22 DIAGNOSIS — I129 Hypertensive chronic kidney disease with stage 1 through stage 4 chronic kidney disease, or unspecified chronic kidney disease: Secondary | ICD-10-CM | POA: Diagnosis not present

## 2017-06-22 DIAGNOSIS — N250 Renal osteodystrophy: Secondary | ICD-10-CM | POA: Diagnosis not present

## 2017-06-22 DIAGNOSIS — N183 Chronic kidney disease, stage 3 (moderate): Secondary | ICD-10-CM | POA: Diagnosis not present

## 2017-06-27 DIAGNOSIS — M722 Plantar fascial fibromatosis: Secondary | ICD-10-CM | POA: Diagnosis not present

## 2017-06-30 DIAGNOSIS — N183 Chronic kidney disease, stage 3 (moderate): Secondary | ICD-10-CM | POA: Diagnosis not present

## 2017-06-30 DIAGNOSIS — I129 Hypertensive chronic kidney disease with stage 1 through stage 4 chronic kidney disease, or unspecified chronic kidney disease: Secondary | ICD-10-CM | POA: Diagnosis not present

## 2017-07-01 DIAGNOSIS — R1110 Vomiting, unspecified: Secondary | ICD-10-CM | POA: Diagnosis not present

## 2017-07-01 DIAGNOSIS — N183 Chronic kidney disease, stage 3 (moderate): Secondary | ICD-10-CM | POA: Diagnosis not present

## 2017-07-01 DIAGNOSIS — I129 Hypertensive chronic kidney disease with stage 1 through stage 4 chronic kidney disease, or unspecified chronic kidney disease: Secondary | ICD-10-CM | POA: Diagnosis not present

## 2017-07-01 DIAGNOSIS — R1013 Epigastric pain: Secondary | ICD-10-CM | POA: Diagnosis not present

## 2017-07-01 DIAGNOSIS — E785 Hyperlipidemia, unspecified: Secondary | ICD-10-CM | POA: Diagnosis not present

## 2017-07-01 DIAGNOSIS — Z87891 Personal history of nicotine dependence: Secondary | ICD-10-CM | POA: Diagnosis not present

## 2017-07-01 DIAGNOSIS — K219 Gastro-esophageal reflux disease without esophagitis: Secondary | ICD-10-CM | POA: Diagnosis not present

## 2017-07-01 DIAGNOSIS — Z905 Acquired absence of kidney: Secondary | ICD-10-CM | POA: Diagnosis not present

## 2017-07-01 DIAGNOSIS — I10 Essential (primary) hypertension: Secondary | ICD-10-CM | POA: Diagnosis not present

## 2017-07-01 DIAGNOSIS — Z79899 Other long term (current) drug therapy: Secondary | ICD-10-CM | POA: Diagnosis not present

## 2017-07-01 DIAGNOSIS — M109 Gout, unspecified: Secondary | ICD-10-CM | POA: Diagnosis not present

## 2017-07-01 DIAGNOSIS — Z7982 Long term (current) use of aspirin: Secondary | ICD-10-CM | POA: Diagnosis not present

## 2017-07-04 DIAGNOSIS — K8080 Other cholelithiasis without obstruction: Secondary | ICD-10-CM | POA: Diagnosis not present

## 2017-07-04 DIAGNOSIS — K219 Gastro-esophageal reflux disease without esophagitis: Secondary | ICD-10-CM | POA: Diagnosis not present

## 2017-07-21 DIAGNOSIS — Z23 Encounter for immunization: Secondary | ICD-10-CM | POA: Diagnosis not present

## 2017-07-21 DIAGNOSIS — I10 Essential (primary) hypertension: Secondary | ICD-10-CM | POA: Diagnosis not present

## 2017-07-21 DIAGNOSIS — N183 Chronic kidney disease, stage 3 (moderate): Secondary | ICD-10-CM | POA: Diagnosis not present

## 2017-07-21 DIAGNOSIS — E785 Hyperlipidemia, unspecified: Secondary | ICD-10-CM | POA: Diagnosis not present

## 2017-07-21 DIAGNOSIS — Z0000 Encounter for general adult medical examination without abnormal findings: Secondary | ICD-10-CM | POA: Diagnosis not present

## 2017-07-31 DIAGNOSIS — R932 Abnormal findings on diagnostic imaging of liver and biliary tract: Secondary | ICD-10-CM | POA: Diagnosis not present

## 2017-07-31 DIAGNOSIS — N281 Cyst of kidney, acquired: Secondary | ICD-10-CM | POA: Diagnosis not present

## 2017-07-31 DIAGNOSIS — R1013 Epigastric pain: Secondary | ICD-10-CM | POA: Diagnosis not present

## 2017-07-31 DIAGNOSIS — K8020 Calculus of gallbladder without cholecystitis without obstruction: Secondary | ICD-10-CM | POA: Diagnosis not present

## 2017-07-31 DIAGNOSIS — N200 Calculus of kidney: Secondary | ICD-10-CM | POA: Diagnosis not present

## 2017-07-31 DIAGNOSIS — E785 Hyperlipidemia, unspecified: Secondary | ICD-10-CM | POA: Diagnosis not present

## 2017-07-31 DIAGNOSIS — I10 Essential (primary) hypertension: Secondary | ICD-10-CM | POA: Diagnosis not present

## 2017-07-31 DIAGNOSIS — K8021 Calculus of gallbladder without cholecystitis with obstruction: Secondary | ICD-10-CM | POA: Diagnosis not present

## 2017-07-31 DIAGNOSIS — Z79899 Other long term (current) drug therapy: Secondary | ICD-10-CM | POA: Diagnosis not present

## 2017-07-31 DIAGNOSIS — K838 Other specified diseases of biliary tract: Secondary | ICD-10-CM | POA: Diagnosis not present

## 2017-07-31 DIAGNOSIS — R109 Unspecified abdominal pain: Secondary | ICD-10-CM | POA: Diagnosis not present

## 2017-07-31 DIAGNOSIS — R079 Chest pain, unspecified: Secondary | ICD-10-CM | POA: Diagnosis not present

## 2017-07-31 DIAGNOSIS — Z87891 Personal history of nicotine dependence: Secondary | ICD-10-CM | POA: Diagnosis not present

## 2017-07-31 DIAGNOSIS — K760 Fatty (change of) liver, not elsewhere classified: Secondary | ICD-10-CM | POA: Diagnosis not present

## 2017-07-31 DIAGNOSIS — K219 Gastro-esophageal reflux disease without esophagitis: Secondary | ICD-10-CM | POA: Diagnosis not present

## 2017-07-31 DIAGNOSIS — R10811 Right upper quadrant abdominal tenderness: Secondary | ICD-10-CM | POA: Diagnosis not present

## 2017-07-31 DIAGNOSIS — R112 Nausea with vomiting, unspecified: Secondary | ICD-10-CM | POA: Diagnosis not present

## 2017-08-01 DIAGNOSIS — I129 Hypertensive chronic kidney disease with stage 1 through stage 4 chronic kidney disease, or unspecified chronic kidney disease: Secondary | ICD-10-CM | POA: Diagnosis not present

## 2017-08-01 DIAGNOSIS — Z87442 Personal history of urinary calculi: Secondary | ICD-10-CM | POA: Diagnosis not present

## 2017-08-01 DIAGNOSIS — E785 Hyperlipidemia, unspecified: Secondary | ICD-10-CM | POA: Diagnosis not present

## 2017-08-01 DIAGNOSIS — M109 Gout, unspecified: Secondary | ICD-10-CM | POA: Diagnosis not present

## 2017-08-01 DIAGNOSIS — Z4682 Encounter for fitting and adjustment of non-vascular catheter: Secondary | ICD-10-CM | POA: Diagnosis not present

## 2017-08-01 DIAGNOSIS — K219 Gastro-esophageal reflux disease without esophagitis: Secondary | ICD-10-CM | POA: Diagnosis not present

## 2017-08-01 DIAGNOSIS — Z6841 Body Mass Index (BMI) 40.0 and over, adult: Secondary | ICD-10-CM | POA: Diagnosis not present

## 2017-08-01 DIAGNOSIS — N183 Chronic kidney disease, stage 3 (moderate): Secondary | ICD-10-CM | POA: Diagnosis not present

## 2017-08-01 DIAGNOSIS — K8020 Calculus of gallbladder without cholecystitis without obstruction: Secondary | ICD-10-CM | POA: Diagnosis not present

## 2017-08-01 DIAGNOSIS — Z79899 Other long term (current) drug therapy: Secondary | ICD-10-CM | POA: Diagnosis not present

## 2017-08-01 DIAGNOSIS — Z905 Acquired absence of kidney: Secondary | ICD-10-CM | POA: Diagnosis not present

## 2017-08-01 DIAGNOSIS — K828 Other specified diseases of gallbladder: Secondary | ICD-10-CM | POA: Diagnosis not present

## 2017-08-01 DIAGNOSIS — K8010 Calculus of gallbladder with chronic cholecystitis without obstruction: Secondary | ICD-10-CM | POA: Diagnosis not present

## 2017-08-09 DIAGNOSIS — Z9889 Other specified postprocedural states: Secondary | ICD-10-CM | POA: Diagnosis not present

## 2017-08-09 DIAGNOSIS — N200 Calculus of kidney: Secondary | ICD-10-CM | POA: Diagnosis not present

## 2017-08-09 DIAGNOSIS — N183 Chronic kidney disease, stage 3 (moderate): Secondary | ICD-10-CM | POA: Diagnosis not present

## 2017-08-09 DIAGNOSIS — I129 Hypertensive chronic kidney disease with stage 1 through stage 4 chronic kidney disease, or unspecified chronic kidney disease: Secondary | ICD-10-CM | POA: Diagnosis not present

## 2017-09-27 DIAGNOSIS — R202 Paresthesia of skin: Secondary | ICD-10-CM | POA: Diagnosis not present

## 2017-09-27 DIAGNOSIS — Z8669 Personal history of other diseases of the nervous system and sense organs: Secondary | ICD-10-CM | POA: Diagnosis not present

## 2017-10-15 DIAGNOSIS — R202 Paresthesia of skin: Secondary | ICD-10-CM | POA: Diagnosis not present

## 2017-10-15 DIAGNOSIS — R208 Other disturbances of skin sensation: Secondary | ICD-10-CM | POA: Diagnosis not present

## 2018-01-05 DIAGNOSIS — I129 Hypertensive chronic kidney disease with stage 1 through stage 4 chronic kidney disease, or unspecified chronic kidney disease: Secondary | ICD-10-CM | POA: Diagnosis not present

## 2018-01-05 DIAGNOSIS — M109 Gout, unspecified: Secondary | ICD-10-CM | POA: Diagnosis not present

## 2018-01-05 DIAGNOSIS — N183 Chronic kidney disease, stage 3 (moderate): Secondary | ICD-10-CM | POA: Diagnosis not present

## 2018-01-05 DIAGNOSIS — M50323 Other cervical disc degeneration at C6-C7 level: Secondary | ICD-10-CM | POA: Diagnosis not present

## 2018-01-05 DIAGNOSIS — K219 Gastro-esophageal reflux disease without esophagitis: Secondary | ICD-10-CM | POA: Diagnosis not present

## 2018-01-11 DIAGNOSIS — R200 Anesthesia of skin: Secondary | ICD-10-CM | POA: Diagnosis not present

## 2018-02-03 DIAGNOSIS — Z0189 Encounter for other specified special examinations: Secondary | ICD-10-CM | POA: Diagnosis not present

## 2018-02-03 DIAGNOSIS — N183 Chronic kidney disease, stage 3 (moderate): Secondary | ICD-10-CM | POA: Diagnosis not present

## 2018-02-03 DIAGNOSIS — N200 Calculus of kidney: Secondary | ICD-10-CM | POA: Diagnosis not present

## 2018-02-03 DIAGNOSIS — I129 Hypertensive chronic kidney disease with stage 1 through stage 4 chronic kidney disease, or unspecified chronic kidney disease: Secondary | ICD-10-CM | POA: Diagnosis not present

## 2018-02-03 LAB — BASIC METABOLIC PANEL
BUN: 15 (ref 4–21)
Creatinine: 1.2 (ref 0.6–1.3)
GLUCOSE: 153
POTASSIUM: 4 (ref 3.4–5.3)
SODIUM: 141 (ref 137–147)

## 2018-02-03 LAB — LIPID PANEL
CHOLESTEROL: 153 (ref 0–200)
HDL: 32 — AB (ref 35–70)
LDL CALC: 78
Triglycerides: 214 — AB (ref 40–160)

## 2018-02-03 LAB — VITAMIN D 25 HYDROXY (VIT D DEFICIENCY, FRACTURES): Vit D, 25-Hydroxy: 46

## 2018-02-03 LAB — MICROALBUMIN, URINE: MICROALBUMIN (UR) POC: 2.31 mg/L

## 2018-02-03 LAB — HEMOGLOBIN A1C: Hemoglobin A1C: 6.6

## 2018-02-03 LAB — CBC AND DIFFERENTIAL: HEMOGLOBIN: 14.7 (ref 13.5–17.5)

## 2018-02-03 LAB — PSA: PSA: 0.556

## 2018-02-03 LAB — TSH: TSH: 1.02 (ref 0.41–5.90)

## 2018-03-14 ENCOUNTER — Encounter: Payer: Self-pay | Admitting: Family Medicine

## 2018-03-14 ENCOUNTER — Ambulatory Visit (INDEPENDENT_AMBULATORY_CARE_PROVIDER_SITE_OTHER): Payer: BLUE CROSS/BLUE SHIELD | Admitting: Family Medicine

## 2018-03-14 VITALS — BP 133/103 | HR 87 | Ht 69.0 in | Wt 310.0 lb

## 2018-03-14 DIAGNOSIS — Z23 Encounter for immunization: Secondary | ICD-10-CM

## 2018-03-14 DIAGNOSIS — M10071 Idiopathic gout, right ankle and foot: Secondary | ICD-10-CM

## 2018-03-14 NOTE — Patient Instructions (Signed)
Thank you for coming in today. Continue colchicine daily or twice daily.  Call or go to the ER if you develop a large red swollen joint with extreme pain or oozing puss.  Recheck if not getting better.   Send me your notes and recent labs (last year or so) from the [place].  email is [email or web address]  Recheck if needed.

## 2018-03-14 NOTE — Progress Notes (Signed)
Jared Harper is a 52 y.o. male who presents to [place] today for gout.   He comes in today with a 1 month history of pain in his right big toe. He has known gout and has not had a flare in over a year. He says this feels similar to his previous flares but not as severe. It hurts to the touch but not to walk on it. He has not been very careful about his low protein diet recently. He is taking allopurinol but does not take colchicine. He says he is careful about NSAIDs because he only has one kidney per patient.  He has had MTP injections in the past that have helped with the pain and he is requesting one today.     ROS:  As above  Exam:  BP (!) 133/103   Pulse 87   Ht 5\' 9"  (1.753 m)   Wt (!) 310 lb (140.6 kg)   BMI 45.78 kg/m  General: Well Developed, well nourished, and in no acute distress.  Neuro/Psych: Alert and oriented x3, extra-ocular muscles intact, able to move all 4 extremities, sensation grossly intact. Skin: Warm and dry, no rashes noted.  Respiratory: Not using accessory muscles, speaking in full sentences, trachea midline.  Cardiovascular: Pulses palpable, no extremity edema. Abdomen: Does not appear distended. 1st MTP: normal appearing with no obvious deformities. Non erythematous Non warm, tender to light palpation  ROM limited by pain  Procedure: Real-time Ultrasound Guided Injection of right first MTP Device: GE Logiq E   Images permanently stored and available for review in the ultrasound unit. Verbal informed consent obtained.  Discussed risks and benefits of procedure. Warned about infection bleeding damage to structures skin hypopigmentation and fat atrophy among others. Patient expresses understanding and agreement Time-out conducted.   Noted no overlying erythema, induration, or other signs of local infection.   Skin prepped in a sterile fashion.   Local anesthesia: Topical Ethyl chloride.   With  sterile technique and under real time ultrasound guidance:  1.5 cc Marcaine and 40 mg DepoMedrol injected easily.   Completed without difficulty   Pain immediately resolved suggesting accurate placement of the medication.   Advised to call if fevers/chills, erythema, induration, drainage, or persistent bleeding.   Images permanently stored and available for review in the ultrasound unit.  Impression: Technically successful ultrasound guided injection.      Lab and Radiology Results Lab Results  Component Value Date   LABURIC 9.6 (H) 06/30/2015     Chemistry      Component Value Date/Time   NA 142 07/21/2016   NA 142 07/21/2016   K 4.2 07/21/2016   K 4.2 07/21/2016   CL 102 06/30/2015 1144   CO2 30 06/30/2015 1144   BUN 14 07/21/2016   CREATININE 1.3 07/21/2016   CREATININE 1.3 07/21/2016   CREATININE 1.48 (H) 06/30/2015 1144   GLU 118 07/21/2016      Component Value Date/Time   CALCIUM 9.5 07/21/2016   CALCIUM 9.5 07/21/2016   ALKPHOS 91 07/21/2016   AST 35 07/21/2016   ALT 79 (A) 07/21/2016   BILITOT 1.0 06/30/2015 1144         Assessment and Plan: 52 y.o. male with first MTP pain due to gout flare. His history suggests an acute gout flare. He is not taking colchicine because he only has one kidney. He received an MTP injection today which he has had in the past and given him relief.  He will continue to take allopurinol and come back to clinic if his pain is not improving.   We will receive medical records from [place] to document written sent to creatinine and uric acids.  Patient may need adjustment in allopurinol.   Influenza vaccination given today    Orders Placed This Encounter  Procedures  . Flu Vaccine QUAD 36+ mos IM   No orders of the defined types were placed in this encounter.   Historical information moved to improve visibility of documentation.  Past Medical History:  Diagnosis Date  . Back pain   . Chronic kidney disease (CKD) stage G3b/A1,  moderately decreased glomerular filtration rate (GFR) between 30-44 mL/min/1.73 square meter and albuminuria creatinine ratio less than 30 mg/g (HCC) 04/16/2011  . Gout   . Hepatitis C antibody test positive    Though has had neg tests as well.   . Hyperlipidemia   . Hypertension   . 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 CARPAL TUNNEL RELEASE  1979   left   . cyst removal on leg  1986  . KIDNEY STONE SURGERY  2009   laser  . staph infection surgery x 3    . THYROGLOSSAL DUCT CYST  1985   Social History   Tobacco Use  . Smoking status: Never Smoker  . Smokeless tobacco: Current User  . Tobacco comment: Snuff/dip  Substance Use Topics  . Alcohol use: Yes    Alcohol/week: 0.0 standard drinks    Comment: hasnt had anything in 3 weeks; normally has "none to hardly"   family history includes Breast cancer in his unknown relative; Diabetes in his brother and unknown relative; Heart attack in his maternal grandfather; Hyperlipidemia in his maternal grandfather; Other in his unknown relative; Stroke in his unknown relative.  Medications: Current Outpatient Medications  Medication Sig Dispense Refill  . allopurinol (ZYLOPRIM) 100 MG tablet TAKE 2 TABLETS TWICE A DAY. 360 tablet 1  . aspirin 81 MG tablet Take 81 mg by mouth daily. Reported on 06/30/2015    . atorvastatin (LIPITOR) 40 MG tablet Take 1 tablet (40 mg total) by mouth daily. APPOINTMENT NEEDED FOR FURTHER REFILLS 90 tablet 1  . clotrimazole-betamethasone (LOTRISONE) cream Apply 1 application topically 2 (two) times daily. 60 g 0  . colchicine 0.6 MG tablet Take 1 tablet (0.6 mg total) by mouth daily. 30 tablet 1  . colchicine 0.6 MG tablet Take 1 tablet (0.6 mg total) by mouth daily. Need to check uric acid level 30 tablet 1  . cyclobenzaprine (FLEXERIL) 10 MG tablet Take 1 tablet (10 mg total) by mouth 2 (two) times daily as needed for muscle spasms. 30 tablet  0  . valsartan (DIOVAN) 80 MG tablet TAKE 1 TABLET BY MOUTH DAILY. **APPOINTMENT REQUIRED FOR FUTURE REFILLS. PLEASE CALL OFFICE** 30 tablet 0   No current facility-administered medications for this visit.    No Known Allergies    Discussed warning signs or symptoms. Please see discharge instructions. Patient expresses understanding.  I personally was present and performed or re-performed the history, physical exam and medical decision-making activities of this service and have verified that the service and findings are accurately documented in the student's note. ___________________________________________ Lynne Leader M.D., ABFM., CAQSM. Primary Care and Sports Medicine Adjunct Instructor of [employer]

## 2018-03-15 ENCOUNTER — Encounter: Payer: Self-pay | Admitting: Family Medicine

## 2018-03-15 NOTE — Progress Notes (Signed)
Received labs from [place] via patient email.  Labs printed and will be abstracted

## 2018-03-31 ENCOUNTER — Encounter: Payer: Self-pay | Admitting: Family Medicine

## 2018-03-31 LAB — CHLORIDE
CALCIUM: 8.9
CO2: 29
Chloride: 107
UREA NITROGEN: 15
Uric Acid, Serum: 4.7

## 2018-04-12 ENCOUNTER — Ambulatory Visit (INDEPENDENT_AMBULATORY_CARE_PROVIDER_SITE_OTHER): Payer: BLUE CROSS/BLUE SHIELD | Admitting: Family Medicine

## 2018-04-12 ENCOUNTER — Encounter: Payer: Self-pay | Admitting: Family Medicine

## 2018-04-12 VITALS — BP 148/94 | HR 66 | Ht 69.0 in | Wt 304.0 lb

## 2018-04-12 DIAGNOSIS — N183 Chronic kidney disease, stage 3 (moderate): Secondary | ICD-10-CM

## 2018-04-12 DIAGNOSIS — N1832 Chronic kidney disease, stage 3b: Secondary | ICD-10-CM

## 2018-04-12 DIAGNOSIS — M109 Gout, unspecified: Secondary | ICD-10-CM | POA: Diagnosis not present

## 2018-04-12 NOTE — Progress Notes (Signed)
Jared Harper is a 52 y.o. male who presents to [facility] today for left toe pain.  Jared Harper has a history of chronic gout involving multiple joints.  He was recently seen on March 14, 2018 for right great toe pain thought to be due to gout flare.  He received a steroid injection which worked quite well.  He no longer has any right toe pain.  However he notes that he has been having pain in his left toe over the last few days that worsened recently.  He is been taking colchicine intermittently and ibuprofen intermittently for the pain which has worked until yesterday and today.  He denies any injury.  He notes pain is consistent with previous episodes of gout.  He is taking allopurinol 200 mg regularly and trying to eat a low purine diet.  He has not had uric acid labs recently.  He receives a lot of his care at the [facility] and recently had his creatinine checked and found to be 1.20 January 2018.    ROS:  As above  Exam:  BP (!) 148/94   Pulse 66   Ht 5\' 9"  (1.753 m)   Wt (!) 304 lb (137.9 kg)   BMI 44.89 kg/m  General: Well Developed, well nourished, and in no acute distress.  Neuro/Psych: Alert and oriented x3, extra-ocular muscles intact, able to move all 4 extremities, sensation grossly intact. Skin: Warm and dry, no rashes noted.  Respiratory: Not using accessory muscles, speaking in full sentences, trachea midline.  Cardiovascular: Pulses palpable, no extremity edema. Abdomen: Does not appear distended. MSK:  Left great toe slightly swollen slightly erythematous tender to palpation decreased motion and MTP.  Capillary refill and sensation are intact distally.  Pulses are intact into the foot.  Antalgic gait present.    Lab and Radiology Results Procedure: Real-time Ultrasound Guided Injection of left 1st toe MTP  Device: GE Logiq E   Images permanently stored and available for review in the ultrasound unit. Verbal informed  consent obtained.  Discussed risks and benefits of procedure. Warned about infection bleeding damage to structures skin hypopigmentation and fat atrophy among others. Patient expresses understanding and agreement Time-out conducted.   Noted no overlying erythema, induration, or other signs of local infection.   Skin prepped in a sterile fashion.   Local anesthesia: Topical Ethyl chloride.   Under ultrasound examination patient has a joint effusion at the first MTP With sterile technique and under real time ultrasound guidance:  1 mL of Marcaine and 40 mg (0.11ml 80mg /ml) Depo-Medrol injected easily.   Completed without difficulty   Pain immediately resolved suggesting accurate placement of the medication.   Advised to call if fevers/chills, erythema, induration, drainage, or persistent bleeding.   Images permanently stored and available for review in the ultrasound unit.  Impression: Technically successful ultrasound guided injection.        Assessment and Plan: 52 y.o. male with left great toe pain consistent with gout flare.  Plan for injection as above.  Patient already has been taking colchicine which has not been super helpful.  Plan to reassess uric acid and metabolic panel today.  Will adjust allopurinol to achieve uric acid less than 6 if possible.  Continue low purine diet.  Continue intermittent colchicine and intermittent NSAIDs as needed for pain.  Discussed also potential over-the-counter cam walker boot for comfort as needed.  Follow-up with PCP soon in the near future as well as with me soon  in the near future if needed.    Orders Placed This Encounter  Procedures  . Uric acid  . BASIC METABOLIC PANEL WITH GFR   No orders of the defined types were placed in this encounter.   Historical information moved to improve visibility of documentation.  Past Medical History:  Diagnosis Date  . Back pain   . Chronic kidney disease (CKD) stage G3b/A1, moderately decreased  glomerular filtration rate (GFR) between 30-44 mL/min/1.73 square meter and albuminuria creatinine ratio less than 30 mg/g (HCC) 04/16/2011  . Gout   . Hepatitis C antibody test positive    Though has had neg tests as well.   . Hyperlipidemia   . Hypertension   . 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 CARPAL TUNNEL RELEASE  1979   left   . cyst removal on leg  1986  . KIDNEY STONE SURGERY  2009   laser  . staph infection surgery x 3    . THYROGLOSSAL DUCT CYST  1985   Social History   Tobacco Use  . Smoking status: Never Smoker  . Smokeless tobacco: Current User  . Tobacco comment: Snuff/dip  Substance Use Topics  . Alcohol use: Yes    Alcohol/week: 0.0 standard drinks    Comment: hasnt had anything in 3 weeks; normally has "none to hardly"   family history includes Breast cancer in his unknown relative; Diabetes in his brother and unknown relative; Heart attack in his maternal grandfather; Hyperlipidemia in his maternal grandfather; Other in his unknown relative; Stroke in his unknown relative.  Medications: Current Outpatient Medications  Medication Sig Dispense Refill  . allopurinol (ZYLOPRIM) 100 MG tablet TAKE 2 TABLETS TWICE A DAY. 360 tablet 1  . aspirin 81 MG tablet Take 81 mg by mouth daily. Reported on 06/30/2015    . atorvastatin (LIPITOR) 40 MG tablet Take 1 tablet (40 mg total) by mouth daily. APPOINTMENT NEEDED FOR FURTHER REFILLS 90 tablet 1  . clotrimazole-betamethasone (LOTRISONE) cream Apply 1 application topically 2 (two) times daily. 60 g 0  . colchicine 0.6 MG tablet Take 1 tablet (0.6 mg total) by mouth daily. 30 tablet 1  . cyclobenzaprine (FLEXERIL) 10 MG tablet Take 1 tablet (10 mg total) by mouth 2 (two) times daily as needed for muscle spasms. 30 tablet 0  . valsartan (DIOVAN) 80 MG tablet TAKE 1 TABLET BY MOUTH DAILY. **APPOINTMENT REQUIRED FOR FUTURE REFILLS. PLEASE CALL OFFICE** 30  tablet 0   No current facility-administered medications for this visit.    No Known Allergies    Discussed warning signs or symptoms. Please see discharge instructions. Patient expresses understanding.

## 2018-04-12 NOTE — Patient Instructions (Signed)
Thank you for coming in today. Call or go to the ER if you develop a large red swollen joint with extreme pain or oozing puss.  Use the cam walker boot as needed for toe pain.  Call or go to the ER if you develop a large red swollen joint with extreme pain or oozing puss.   Get labs now.  I will get results to you ASAP.

## 2018-04-13 LAB — BASIC METABOLIC PANEL WITH GFR
BUN: 13 mg/dL (ref 7–25)
CO2: 25 mmol/L (ref 20–32)
CREATININE: 1.18 mg/dL (ref 0.70–1.33)
Calcium: 9.3 mg/dL (ref 8.6–10.3)
Chloride: 106 mmol/L (ref 98–110)
GFR, Est African American: 81 mL/min/{1.73_m2} (ref >=60)
GFR, Est Non African American: 70 mL/min/{1.73_m2} (ref >=60)
Glucose, Bld: 137 mg/dL — ABNORMAL HIGH (ref 65–99)
POTASSIUM: 4.3 mmol/L (ref 3.5–5.3)
SODIUM: 141 mmol/L (ref 135–146)

## 2018-04-13 LAB — URIC ACID: Uric Acid, Serum: 4.8 mg/dL (ref 4.0–8.0)

## 2018-04-14 ENCOUNTER — Ambulatory Visit (INDEPENDENT_AMBULATORY_CARE_PROVIDER_SITE_OTHER): Payer: BLUE CROSS/BLUE SHIELD

## 2018-04-14 ENCOUNTER — Ambulatory Visit (INDEPENDENT_AMBULATORY_CARE_PROVIDER_SITE_OTHER): Payer: BLUE CROSS/BLUE SHIELD | Admitting: Family Medicine

## 2018-04-14 ENCOUNTER — Encounter: Payer: Self-pay | Admitting: Family Medicine

## 2018-04-14 VITALS — BP 134/98 | HR 81 | Ht 69.0 in | Wt 298.1 lb

## 2018-04-14 DIAGNOSIS — M7989 Other specified soft tissue disorders: Secondary | ICD-10-CM

## 2018-04-14 DIAGNOSIS — M79675 Pain in left toe(s): Secondary | ICD-10-CM

## 2018-04-14 MED ORDER — COLCHICINE 0.6 MG PO TABS: 0.6000 mg | ORAL_TABLET | Freq: Every day | ORAL | 1 refills | Status: DC

## 2018-04-14 NOTE — Patient Instructions (Signed)
Thank you for coming in today. Continue colchicine.  Call or go to the ER if you develop a large red swollen joint with extreme pain or oozing puss.

## 2018-04-14 NOTE — Progress Notes (Signed)
Jared Harper is a 52 y.o. male who presents to [facility] today for follow-up left great toe pain.  Jared Harper is seen 2 days ago for pain and swelling of the left great toe thought to be due to gout.  Pain was consistent with previous episodes of gout.  He had been taking colchicine with limited success prior to his visit.  He had intra-articular injection of first MTP during the visit.  He notes his pain is not improved much at all.  He notes the pain is now more located at the interphalangeal joint at the first toe rather than the MTP.  He has used a Banker which does help.  No fevers chills nausea vomiting or diarrhea.  No injury.    ROS:  As above  Exam:  BP (!) 134/98   Pulse 81   Ht 5\' 9"  (1.753 m)   Wt 298 lb 1.6 oz (135.2 kg)   BMI 44.02 kg/m  General: Well Developed, well nourished, and in no acute distress.  Neuro/Psych: Alert and oriented x3, extra-ocular muscles intact, able to move all 4 extremities, sensation grossly intact. Skin: Warm and dry, no rashes noted.  Respiratory: Not using accessory muscles, speaking in full sentences, trachea midline.  Cardiovascular: Pulses palpable, no extremity edema. Abdomen: Does not appear distended. MSK:  Left great toe erythematous and swollen.  Not particularly tender at first MTP.  Tender to palpation at interphalangeal joint.  Limited motion due to pain.  Pulses capillary fill and sensation are intact.    Lab and Radiology Results  X-ray images left great toe show no acute fracture or severe degenerative changes.  Images personally independently reviewed. Await formal radiology review.  Procedure: Real-time Ultrasound Guided Injection of left great toe IP joint Device: GE Logiq E   Images permanently stored and available for review in the ultrasound unit. Verbal informed consent obtained.  Discussed risks and benefits of procedure. Warned about infection bleeding  damage to structures skin hypopigmentation and fat atrophy among others. Patient expresses understanding and agreement Time-out conducted.   Noted no overlying erythema, induration, or other signs of local infection.   Skin prepped in a sterile fashion.   Local anesthesia: Topical Ethyl chloride.   With sterile technique and under real time ultrasound guidance:  40 mg of Depo-Medrol and 0.5 mL of Marcaine (total volume 1 mL)  injected easily.   Completed without difficulty   Pain immediately resolved suggesting accurate placement of the medication.   Advised to call if fevers/chills, erythema, induration, drainage, or persistent bleeding.   Images permanently stored and available for review in the ultrasound unit.  Impression: Technically successful ultrasound guided injection.      Results for orders placed or performed in visit on 04/12/18 (from the past 72 hour(s))  Uric acid     Status: None   Collection Time: 04/12/18 10:13 AM  Result Value Ref Range   Uric Acid, Serum 4.8 4.0 - 8.0 mg/dL    Comment: Therapeutic target for gout patients: <6.0 mg/dL .   BASIC METABOLIC PANEL WITH GFR     Status: Abnormal   Collection Time: 04/12/18 10:13 AM  Result Value Ref Range   Glucose, Bld 137 (H) 65 - 99 mg/dL    Comment: .            Fasting reference interval . For someone without known diabetes, a glucose value >125 mg/dL indicates that they may have diabetes and this  should be confirmed with a follow-up test. .    BUN 13 7 - 25 mg/dL   Creat 1.18 0.70 - 1.33 mg/dL    Comment: For patients >80 years of age, the reference limit for Creatinine is approximately 13% higher for people identified as African-American. .    GFR, Est Non African American 70 > OR = 60 mL/min/1.57m2   GFR, Est African American 81 > OR = 60 mL/min/1.47m2   BUN/Creatinine Ratio NOT APPLICABLE 6 - 22 (calc)   Sodium 141 135 - 146 mmol/L   Potassium 4.3 3.5 - 5.3 mmol/L   Chloride 106 98 - 110 mmol/L     CO2 25 20 - 32 mmol/L   Calcium 9.3 8.6 - 10.3 mg/dL        Assessment and Plan: 52 y.o. male with left great toe pain more concentrated at interphalangeal joint.  Likely gout flare in this joint was the majority of the cause of the pain not MTP pain.  Injected today with  significant improvement in pain immediately following injection.  Plan to continue CAM Walker boot and colchicine.  Recheck as needed.    Orders Placed This Encounter  Procedures  . DG Toe Great Left    Order Specific Question:   Reason for exam:    Answer:   eval pain and swelling left great toe. Hx gout    Order Specific Question:   Preferred imaging location?    Answer:   [facility]   No orders of the defined types were placed in this encounter.   Historical information moved to improve visibility of documentation.  Past Medical History:  Diagnosis Date  . Back pain   . Chronic kidney disease (CKD) stage G3b/A1, moderately decreased glomerular filtration rate (GFR) between 30-44 mL/min/1.73 square meter and albuminuria creatinine ratio less than 30 mg/g (HCC) 04/16/2011  . Gout   . Hepatitis C antibody test positive    Though has had neg tests as well.   . Hyperlipidemia   . Hypertension   . 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 CARPAL TUNNEL RELEASE  1979   left   . cyst removal on leg  1986  . KIDNEY STONE SURGERY  2009   laser  . staph infection surgery x 3    . THYROGLOSSAL DUCT CYST  1985   Social History   Tobacco Use  . Smoking status: Never Smoker  . Smokeless tobacco: Current User  . Tobacco comment: Snuff/dip  Substance Use Topics  . Alcohol use: Yes    Alcohol/week: 0.0 standard drinks    Comment: hasnt had anything in 3 weeks; normally has "none to hardly"   family history includes Breast cancer in his unknown relative; Diabetes in his brother and unknown relative; Heart attack in his maternal  grandfather; Hyperlipidemia in his maternal grandfather; Other in his unknown relative; Stroke in his unknown relative.  Medications: Current Outpatient Medications  Medication Sig Dispense Refill  . allopurinol (ZYLOPRIM) 100 MG tablet TAKE 2 TABLETS TWICE A DAY. 360 tablet 1  . aspirin 81 MG tablet Take 81 mg by mouth daily. Reported on 06/30/2015    . atorvastatin (LIPITOR) 40 MG tablet Take 1 tablet (40 mg total) by mouth daily. APPOINTMENT NEEDED FOR FURTHER REFILLS 90 tablet 1  . clotrimazole-betamethasone (LOTRISONE) cream Apply 1 application topically 2 (two) times daily. 60 g 0  . colchicine 0.6 MG tablet  Take 1 tablet (0.6 mg total) by mouth daily. 30 tablet 1  . cyclobenzaprine (FLEXERIL) 10 MG tablet Take 1 tablet (10 mg total) by mouth 2 (two) times daily as needed for muscle spasms. 30 tablet 0  . valsartan (DIOVAN) 80 MG tablet TAKE 1 TABLET BY MOUTH DAILY. **APPOINTMENT REQUIRED FOR FUTURE REFILLS. PLEASE CALL OFFICE** 30 tablet 0   No current facility-administered medications for this visit.    No Known Allergies    Discussed warning signs or symptoms. Please see discharge instructions. Patient expresses understanding.

## 2018-04-18 ENCOUNTER — Telehealth: Payer: Self-pay | Admitting: Family Medicine

## 2018-04-18 MED ORDER — COLCHICINE 0.6 MG PO CAPS: 1.0000 | ORAL_CAPSULE | Freq: Every day | ORAL | 2 refills | Status: AC | PRN

## 2018-04-18 NOTE — Telephone Encounter (Signed)
I received a fax from [facility] requesting switching from colchicine tablets to capsules as it is going to be cheaper for you.  I think this is absolutely just fine and will send the fax back saying that is okay.  Did you get your medicine filled at [facility]?

## 2018-04-28 DIAGNOSIS — N200 Calculus of kidney: Secondary | ICD-10-CM | POA: Diagnosis not present

## 2018-05-15 DIAGNOSIS — N400 Enlarged prostate without lower urinary tract symptoms: Secondary | ICD-10-CM | POA: Diagnosis not present

## 2018-05-15 DIAGNOSIS — Z87442 Personal history of urinary calculi: Secondary | ICD-10-CM | POA: Diagnosis not present

## 2018-09-25 ENCOUNTER — Other Ambulatory Visit: Payer: Self-pay

## 2018-09-25 ENCOUNTER — Ambulatory Visit: Payer: 59 | Admitting: Family Medicine

## 2018-09-25 ENCOUNTER — Encounter: Payer: Self-pay | Admitting: Family Medicine

## 2018-09-25 VITALS — BP 139/89 | HR 96 | Ht 69.0 in | Wt 298.0 lb

## 2018-09-25 DIAGNOSIS — M79671 Pain in right foot: Secondary | ICD-10-CM

## 2018-09-25 NOTE — Progress Notes (Signed)
Jared Harper is a 53 y.o. male who presents to [facility] today for right foot pain.  Jared Harper notes right midfoot pain starting a few days ago.  He thought the pain was gout at first and started taking his colchicine.  He notes taking 0.6 mg of colchicine daily tends to cause some diarrhea.  He additionally has been taking ibuprofen which has helped as well.  He notes the pain is not confined to his great toe.  He notes the pain is in the great toe as well as in the dorsal midfoot.  Pain is worse with activity and better with rest.  No fevers chills nausea vomiting diarrhea.  No injury or change in activity level.    ROS:  As above  Exam:  BP 139/89   Pulse 96   Ht 5\' 9"  (1.753 m)   Wt 298 lb (135.2 kg)   BMI 44.01 kg/m  Wt Readings from Last 5 Encounters:  09/25/18 298 lb (135.2 kg)  04/14/18 298 lb 1.6 oz (135.2 kg)  04/12/18 (!) 304 lb (137.9 kg)  03/14/18 (!) 310 lb (140.6 kg)  08/12/16 290 lb (131.5 kg)   General: Well Developed, well nourished, and in no acute distress.  Neuro/Psych: Alert and oriented x3, extra-ocular muscles intact, able to move all 4 extremities, sensation grossly intact. Skin: Warm and dry, no rashes noted.  Respiratory: Not using accessory muscles, speaking in full sentences, trachea midline.  Cardiovascular: Pulses palpable, no extremity edema. Abdomen: Does not appear distended. MSK: Foot largely normal-appearing with no significant swelling or erythema. Normal motion.  Mildly tender to palpation along dorsal midfoot and to the metatarsal heads at 1-3 and 4 MTPs. Normal gait.    Lab and Radiology Results No results found for this or any previous visit (from the past 72 hour(s)). No results found.     Assessment and Plan: 53 y.o. male with right foot pain.  Etiology somewhat unclear.  Possibly metatarsalgia versus gout versus stress reaction.  Plan for postop shoe and relative rest.  Avoid  NSAIDs if possible given chronic kidney disease.  Consider lower dose colchicine as needed as tolerated for diarrhea.  Recheck if not improving.   PDMP not reviewed this encounter. No orders of the defined types were placed in this encounter.  No orders of the defined types were placed in this encounter.   Historical information moved to improve visibility of documentation.  Past Medical History:  Diagnosis Date  . Back pain   . Chronic kidney disease (CKD) stage G3b/A1, moderately decreased glomerular filtration rate (GFR) between 30-44 mL/min/1.73 square meter and albuminuria creatinine ratio less than 30 mg/g (HCC) 04/16/2011  . Gout   . Hepatitis C antibody test positive    Though has had neg tests as well.   . Hyperlipidemia   . Hypertension   . 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 CARPAL TUNNEL RELEASE  1979   left   . cyst removal on leg  1986  . KIDNEY STONE SURGERY  2009   laser  . staph infection surgery x 3    . THYROGLOSSAL DUCT CYST  1985   Social History   Tobacco Use  . Smoking status: Never Smoker  . Smokeless tobacco: Current User  . Tobacco comment: Snuff/dip  Substance Use Topics  . Alcohol use: Yes    Alcohol/week: 0.0 standard drinks  Comment: hasnt had anything in 3 weeks; normally has "none to hardly"   family history includes Breast cancer in his unknown relative; Diabetes in his brother and unknown relative; Heart attack in his maternal grandfather; Hyperlipidemia in his maternal grandfather; Other in his unknown relative; Stroke in his unknown relative.  Medications: Current Outpatient Medications  Medication Sig Dispense Refill  . allopurinol (ZYLOPRIM) 100 MG tablet TAKE 2 TABLETS TWICE A DAY. 360 tablet 1  . aspirin 81 MG tablet Take 81 mg by mouth daily. Reported on 06/30/2015    . atorvastatin (LIPITOR) 40 MG tablet Take 1 tablet (40 mg total) by mouth daily.  APPOINTMENT NEEDED FOR FURTHER REFILLS 90 tablet 1  . clotrimazole-betamethasone (LOTRISONE) cream Apply 1 application topically 2 (two) times daily. 60 g 0  . Colchicine 0.6 MG CAPS Take 1 capsule by mouth daily as needed (gout pain). 30 capsule 2  . cyclobenzaprine (FLEXERIL) 10 MG tablet Take 1 tablet (10 mg total) by mouth 2 (two) times daily as needed for muscle spasms. 30 tablet 0  . valsartan (DIOVAN) 80 MG tablet TAKE 1 TABLET BY MOUTH DAILY. **APPOINTMENT REQUIRED FOR FUTURE REFILLS. PLEASE CALL OFFICE** 30 tablet 0   No current facility-administered medications for this visit.    No Known Allergies    Discussed warning signs or symptoms. Please see discharge instructions. Patient expresses understanding.

## 2018-09-25 NOTE — Patient Instructions (Addendum)
Thank you for coming in today.  Try using the post op rigid sole shoe for 1 week.  Consider continuing the colchicine. Try taking half dose.  Ok to continue limited ibuprofen.  If worsening we can do a shot. Having 1 bad area is better than diffuse pain for shots.   Keep an eye on it and let me know.   Use that post op shoe.

## 2019-02-21 ENCOUNTER — Encounter: Payer: Self-pay | Admitting: Family Medicine

## 2019-02-21 ENCOUNTER — Ambulatory Visit (INDEPENDENT_AMBULATORY_CARE_PROVIDER_SITE_OTHER): Payer: 59 | Admitting: Family Medicine

## 2019-02-21 ENCOUNTER — Other Ambulatory Visit: Payer: Self-pay

## 2019-02-21 VITALS — BP 134/93 | HR 67 | Temp 98.3°F | Wt 290.0 lb

## 2019-02-21 DIAGNOSIS — M109 Gout, unspecified: Secondary | ICD-10-CM

## 2019-02-21 NOTE — Patient Instructions (Addendum)
Thank you for coming in today. Call or go to the ER if you develop a large red swollen joint with extreme pain or oozing puss.  Return as needed.  Wean out of the boot as soon as you can.   Try to get the labs from the [place] sent to Dr Madilyn Fireman.  Send as attachment with Mychart to Dr Madilyn Fireman.   I will be moving to full time Sports Medicine in [place] starting on November 1st.  You will still be able to see me for your Sports Medicine or Orthopedic needs at [facility] in [place]. I will still be part of [place].    If you want to stay locally for your Sports Medicine issues Dr. Dianah Field here in [place] will be happy to see you.  Additionally Dr. Clearance Coots at [facility] will be happy to see you for sports medicine issues more locally.   For your primary care needs you are welcome to establish care with Dr. Emeterio Reeve.  We are working quickly to hire more physicians to cover the primary care needs however if you cannot get an appointment with Dr. Sheppard Coil in a timely manner [place] [facility] has locations and openings for primary care services nearby.   [facility] at [facility] [address] . [place] , [place]: [phone] . Behavioral Medicine: [phone] . Fax: [place] at [employer] [address] . [place], [place]: [phone] . Behavioral Medicine: [phone] . Fax: [phone] . Hours (M-F): 7am - Academic librarian At [facility]. [place], [place]: [phone] . Behavioral Medicine: [phone] . Fax: [phone] . Hours (M-F): 8am - Optician, dispensing at [employer] . [place], [place] Phone: [phone] . Behavioral Medicine: [phone] . Fax: [phone]

## 2019-02-21 NOTE — Progress Notes (Signed)
Jared Harper is a 53 y.o. male who presents to [facility] today for left great toe pain.  Patient developed pain in the left great toe starting yesterday.  He cannot recall any injury or incident.  No change in activity.  He tried ibuprofen and colchicine which did not help much.  Pain is quite severe at times.   Patient was seen for left great toe pain in October 2019.  Initially thought to be gout however uric acid level was 4.8 and he failed colchicine.  He was treated with injection October 25.  X-ray in October did show cortical erosions periarticular along medial corner compatible with gout.   ROS:  As above  Exam:  BP (!) 134/93   Pulse 67   Temp 98.3 F (36.8 C) (Oral)   Wt 290 lb (131.5 kg)   BMI 42.83 kg/m  Wt Readings from Last 5 Encounters:  02/21/19 290 lb (131.5 kg)  09/25/18 298 lb (135.2 kg)  04/14/18 298 lb 1.6 oz (135.2 kg)  04/12/18 (!) 304 lb (137.9 kg)  03/14/18 (!) 310 lb (140.6 kg)   General: Well Developed, well nourished, and in no acute distress.  Neuro/Psych: Alert and oriented x3, extra-ocular muscles intact, able to move all 4 extremities, sensation grossly intact. Skin: Warm and dry, no rashes noted.  Respiratory: Not using accessory muscles, speaking in full sentences, trachea midline.  Cardiovascular: Pulses palpable, no extremity edema. Abdomen: Does not appear distended. MSK:  Left foot: Relatively normal-appearing slight swelling at first MTP without erythema. Tender palpation MTP especially medially. Decreased motion. Pulses cap refill and sensation are intact distally.    Lab and Radiology Results EXAM: LEFT GREAT TOE  COMPARISON:  None.  FINDINGS: Nonspecific soft tissue swelling particularly in the region of the MTP joint. I think there is a cortical erosion of the periarticular bone at the medial corner.  IMPRESSION: Nonspecific soft tissue swelling in the region of  the MTP joint of the great toe. I think there is a cortical erosion of the periarticular bone along the medial corner. This is compatible with gout.   Electronically Signed   By: Nelson Chimes M.D.   On: 04/14/2018 17:24  I personally (independently) visualized and performed the interpretation of the images attached in this note.   Procedure: Real-time Ultrasound Guided Injection of left first MTP Device: GE Logiq E   Images permanently stored and available for review in the ultrasound unit. Verbal informed consent obtained.  Discussed risks and benefits of procedure. Warned about infection bleeding damage to structures skin hypopigmentation and fat atrophy among others. Patient expresses understanding and agreement Time-out conducted.   Noted no overlying erythema, induration, or other signs of local infection.   Skin prepped in a sterile fashion.   Local anesthesia: Topical Ethyl chloride.   With sterile technique and under real time ultrasound guidance:  40 mg of Depo-Medrol, and 0.5 mL of Marcaine.  Total volume 1 mL injected easily.   Completed without difficulty   Pain immediately resolved suggesting accurate placement of the medication.   Advised to call if fevers/chills, erythema, induration, drainage, or persistent bleeding.   Images permanently stored and available for review in the ultrasound unit.  Impression: Technically successful ultrasound guided injection.         Assessment and Plan: 53 y.o. male with left great toe pain.  Likely gout flare.  Plan to continue current regimen including cam walker boot, ibuprofen and colchicine.  Injection  as above.  Recheck if not improving.  Recommend sending records from [place] to PCP.   PDMP not reviewed this encounter. No orders of the defined types were placed in this encounter.  No orders of the defined types were placed in this encounter.   Historical information moved to improve visibility of documentation.   Past Medical History:  Diagnosis Date  . Back pain   . Chronic kidney disease (CKD) stage G3b/A1, moderately decreased glomerular filtration rate (GFR) between 30-44 mL/min/1.73 square meter and albuminuria creatinine ratio less than 30 mg/g (HCC) 04/16/2011  . Gout   . Hepatitis C antibody test positive    Though has had neg tests as well.   . Hyperlipidemia   . Hypertension   . 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 CARPAL TUNNEL RELEASE  1979   left   . cyst removal on leg  1986  . KIDNEY STONE SURGERY  2009   laser  . staph infection surgery x 3    . THYROGLOSSAL DUCT CYST  1985   Social History   Tobacco Use  . Smoking status: Never Smoker  . Smokeless tobacco: Current User  . Tobacco comment: Snuff/dip  Substance Use Topics  . Alcohol use: Yes    Alcohol/week: 0.0 standard drinks    Comment: hasnt had anything in 3 weeks; normally has "none to hardly"   family history includes Breast cancer in his unknown relative; Diabetes in his brother and unknown relative; Heart attack in his maternal grandfather; Hyperlipidemia in his maternal grandfather; Other in his unknown relative; Stroke in his unknown relative.  Medications: Current Outpatient Medications  Medication Sig Dispense Refill  . metFORMIN (GLUCOPHAGE) 1000 MG tablet Take 1,000 mg by mouth 2 (two) times daily with a meal.    . allopurinol (ZYLOPRIM) 100 MG tablet TAKE 2 TABLETS TWICE A DAY. 360 tablet 1  . aspirin 81 MG tablet Take 81 mg by mouth daily. Reported on 06/30/2015    . atorvastatin (LIPITOR) 40 MG tablet Take 1 tablet (40 mg total) by mouth daily. APPOINTMENT NEEDED FOR FURTHER REFILLS 90 tablet 1  . clotrimazole-betamethasone (LOTRISONE) cream Apply 1 application topically 2 (two) times daily. 60 g 0  . Colchicine 0.6 MG CAPS Take 1 capsule by mouth daily as needed (gout pain). 30 capsule 2  . cyclobenzaprine (FLEXERIL) 10 MG tablet  Take 1 tablet (10 mg total) by mouth 2 (two) times daily as needed for muscle spasms. 30 tablet 0  . valsartan (DIOVAN) 80 MG tablet TAKE 1 TABLET BY MOUTH DAILY. **APPOINTMENT REQUIRED FOR FUTURE REFILLS. PLEASE CALL OFFICE** 30 tablet 0   No current facility-administered medications for this visit.    No Known Allergies    Discussed warning signs or symptoms. Please see discharge instructions. Patient expresses understanding.

## 2019-04-11 ENCOUNTER — Ambulatory Visit: Payer: 59 | Admitting: Family Medicine

## 2019-04-11 ENCOUNTER — Other Ambulatory Visit: Payer: Self-pay

## 2019-04-11 ENCOUNTER — Encounter: Payer: Self-pay | Admitting: Family Medicine

## 2019-04-11 ENCOUNTER — Ambulatory Visit (INDEPENDENT_AMBULATORY_CARE_PROVIDER_SITE_OTHER): Payer: 59

## 2019-04-11 VITALS — BP 148/91 | HR 78 | Temp 98.0°F | Wt 294.0 lb

## 2019-04-11 DIAGNOSIS — M79675 Pain in left toe(s): Secondary | ICD-10-CM

## 2019-04-11 DIAGNOSIS — I10 Essential (primary) hypertension: Secondary | ICD-10-CM | POA: Diagnosis not present

## 2019-04-11 DIAGNOSIS — E785 Hyperlipidemia, unspecified: Secondary | ICD-10-CM

## 2019-04-11 DIAGNOSIS — R7301 Impaired fasting glucose: Secondary | ICD-10-CM

## 2019-04-11 DIAGNOSIS — N1832 Chronic kidney disease, stage 3b: Secondary | ICD-10-CM

## 2019-04-11 DIAGNOSIS — Z23 Encounter for immunization: Secondary | ICD-10-CM

## 2019-04-11 DIAGNOSIS — M10362 Gout due to renal impairment, left knee: Secondary | ICD-10-CM

## 2019-04-11 MED ORDER — HYDROCODONE-ACETAMINOPHEN 5-325 MG PO TABS: 1.0000 | ORAL_TABLET | Freq: Four times a day (QID) | ORAL | 0 refills | Status: DC | PRN

## 2019-04-11 NOTE — Progress Notes (Signed)
Jared Harper is a 53 y.o. male who presents to [facility] today for left great toe pain.  Patient has a history of recurrent left great toe pain thought to be due to gout.  He was seen in early September for left great toe pain occurring spontaneously.  He was prescribed colchicine and given a interarticular steroid injection at the left first MTP.  This worked quite well until a few days ago and his pain returned.  He notes pain and swelling mostly at the plantar aspect of the first MTP.  Pain is consistent with previous episodes of gout.  He has been trying ibuprofen and colchicine with little benefit.  He denies any injury or any exacerbating factors.  He typically receives a lot of his care at the [place].  Last uric acid with our labs was about a year ago less than 5.  ROS:  As above  Exam:  BP (!) 148/91   Pulse 78   Temp 98 F (36.7 C) (Oral)   Wt 294 lb (133.4 kg)   BMI 43.42 kg/m  Wt Readings from Last 5 Encounters:  04/11/19 294 lb (133.4 kg)  02/21/19 290 lb (131.5 kg)  09/25/18 298 lb (135.2 kg)  04/14/18 298 lb 1.6 oz (135.2 kg)  04/12/18 (!) 304 lb (137.9 kg)   General: Well Developed, well nourished, and in no acute distress.  Neuro/Psych: Alert and oriented x3, extra-ocular muscles intact, able to move all 4 extremities, sensation grossly intact. Skin: Warm and dry, no rashes noted.  Respiratory: Not using accessory muscles, speaking in full sentences, trachea midline.  Cardiovascular: Pulses palpable, no extremity edema. Abdomen: Does not appear distended. MSK:  Left great toe: Slightly swollen no erythema.  Tender palpation plantar aspect.  Decreased motion.  Pulses cap refill and sensation are intact distally.    Lab and Radiology Results X-ray images left first MTP obtained today personally and independently reviewed No fractures visible.  Mild degenerative changes present. Await formal radiology review   Procedure: Real-time Ultrasound Guided Injection of left first MTP medial side Device: GE Logiq E   Images permanently stored and available for review in the ultrasound unit. Verbal informed consent obtained.  Discussed risks and benefits of procedure. Warned about infection bleeding damage to structures skin hypopigmentation and fat atrophy among others. Patient expresses understanding and agreement Time-out conducted.   Noted no overlying erythema, induration, or other signs of local infection.   Skin prepped in a sterile fashion.   Local anesthesia: Topical Ethyl chloride.   With sterile technique and under real time ultrasound guidance:  40 mg of Depo-Medrol and 0.1 mL of Marcaine total volume 1 mL injected easily.   Completed without difficulty    Pain did not resolved immediately after injection however intra-articular injection confirmed on ultrasound imaging.      Advised to call if fevers/chills, erythema, induration, drainage, or persistent bleeding.   Images permanently stored and available for review in the ultrasound unit.  Impression: Technically successful ultrasound guided injection.        Assessment and Plan: 53 y.o. male with left great toe pain and swelling.  History of gout.  Symptoms are consistent with gout however patient did not have immediate response to intra-articular injection today.  Plan for lab assessment below including CBC with differential metabolic panel and uric acid as well as sed rate.  Limited prescription of hydrocodone for pain control.  If not improving patient will inform me.  Neck step would probably be MRI.  Additionally patient is overdue for some of his basic labs including direct LDL and A1c for diabetes and hyperlipidemia and hypertension.  Flu vaccine also given today prior to discharge.  PDMP reviewed during this encounter. Orders Placed This Encounter  Procedures  . DG Toe Great Left    Order Specific Question:   Reason for  exam:    Answer:   eval pain    Order Specific Question:   Preferred imaging location?    Answer:   [facility]  . Flu Vaccine QUAD 6+ mos PF IM (Fluarix Quad PF)  . COMPLETE METABOLIC PANEL WITH GFR  . CBC with Differential/Platelet  . Uric acid  . Sedimentation rate  . Hemoglobin A1c  . LDL cholesterol, direct   Meds ordered this encounter  Medications  . HYDROcodone-acetaminophen (NORCO/VICODIN) 5-325 MG tablet    Sig: Take 1 tablet by mouth every 6 (six) hours as needed.    Dispense:  15 tablet    Refill:  0    Historical information moved to improve visibility of documentation.  Past Medical History:  Diagnosis Date  . Back pain   . Chronic kidney disease (CKD) stage G3b/A1, moderately decreased glomerular filtration rate (GFR) between 30-44 mL/min/1.73 square meter and albuminuria creatinine ratio less than 30 mg/g 04/16/2011  . Gout   . Hepatitis C antibody test positive    Though has had neg tests as well.   . Hyperlipidemia   . Hypertension   . 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 CARPAL TUNNEL RELEASE  1979   left   . cyst removal on leg  1986  . KIDNEY STONE SURGERY  2009   laser  . staph infection surgery x 3    . THYROGLOSSAL DUCT CYST  1985   Social History   Tobacco Use  . Smoking status: Never Smoker  . Smokeless tobacco: Current User  . Tobacco comment: Snuff/dip  Substance Use Topics  . Alcohol use: Yes    Alcohol/week: 0.0 standard drinks    Comment: hasnt had anything in 3 weeks; normally has "none to hardly"   family history includes Breast cancer in his unknown relative; Diabetes in his brother and unknown relative; Heart attack in his maternal grandfather; Hyperlipidemia in his maternal grandfather; Other in his unknown relative; Stroke in his unknown relative.  Medications: Current Outpatient Medications  Medication Sig Dispense Refill  . allopurinol  (ZYLOPRIM) 100 MG tablet TAKE 2 TABLETS TWICE A DAY. 360 tablet 1  . aspirin 81 MG tablet Take 81 mg by mouth daily. Reported on 06/30/2015    . atorvastatin (LIPITOR) 40 MG tablet Take 1 tablet (40 mg total) by mouth daily. APPOINTMENT NEEDED FOR FURTHER REFILLS 90 tablet 1  . clotrimazole-betamethasone (LOTRISONE) cream Apply 1 application topically 2 (two) times daily. 60 g 0  . Colchicine 0.6 MG CAPS Take 1 capsule by mouth daily as needed (gout pain). 30 capsule 2  . cyclobenzaprine (FLEXERIL) 10 MG tablet Take 1 tablet (10 mg total) by mouth 2 (two) times daily as needed for muscle spasms. 30 tablet 0  . metFORMIN (GLUCOPHAGE) 1000 MG tablet Take 1,000 mg by mouth 2 (two) times daily with a meal.    . valsartan (DIOVAN) 80 MG tablet TAKE 1 TABLET BY MOUTH DAILY. **APPOINTMENT REQUIRED FOR FUTURE REFILLS. PLEASE CALL OFFICE** 30 tablet 0  . HYDROcodone-acetaminophen (NORCO/VICODIN) 5-325  MG tablet Take 1 tablet by mouth every 6 (six) hours as needed. 15 tablet 0   No current facility-administered medications for this visit.    No Known Allergies    Discussed warning signs or symptoms. Please see discharge instructions. Patient expresses understanding.

## 2019-04-11 NOTE — Patient Instructions (Signed)
Thank you for coming in today. Get labs now.  Use norco and boot as needed.  Let me know if not better.   Call or go to the ER if you develop a large red swollen joint with extreme pain or oozing puss.

## 2019-04-12 LAB — CBC WITH DIFFERENTIAL/PLATELET
Absolute Monocytes: 561 cells/uL (ref 200–950)
Basophils Absolute: 57 cells/uL (ref 0–200)
Basophils Relative: 0.8 %
Eosinophils Absolute: 213 cells/uL (ref 15–500)
Eosinophils Relative: 3 %
HCT: 41 % (ref 38.5–50.0)
Hemoglobin: 14 g/dL (ref 13.2–17.1)
Lymphs Abs: 2087 cells/uL (ref 850–3900)
MCH: 31.1 pg (ref 27.0–33.0)
MCHC: 34.1 g/dL (ref 32.0–36.0)
MCV: 91.1 fL (ref 80.0–100.0)
MPV: 11.5 fL (ref 7.5–12.5)
Monocytes Relative: 7.9 %
Neutro Abs: 4182 cells/uL (ref 1500–7800)
Neutrophils Relative %: 58.9 %
Platelets: 257 10*3/uL (ref 140–400)
RBC: 4.5 10*6/uL (ref 4.20–5.80)
RDW: 13 % (ref 11.0–15.0)
Total Lymphocyte: 29.4 %
WBC: 7.1 10*3/uL (ref 3.8–10.8)

## 2019-04-12 LAB — HEMOGLOBIN A1C
Hgb A1c MFr Bld: 5.8 % of total Hgb — ABNORMAL HIGH (ref <5.7)
Mean Plasma Glucose: 120 (calc)
eAG (mmol/L): 6.6 (calc)

## 2019-04-12 LAB — COMPLETE METABOLIC PANEL WITH GFR
AG Ratio: 1.8 (calc) (ref 1.0–2.5)
ALT: 36 U/L (ref 9–46)
AST: 23 U/L (ref 10–35)
Albumin: 4.5 g/dL (ref 3.6–5.1)
Alkaline phosphatase (APISO): 86 U/L (ref 35–144)
BUN: 12 mg/dL (ref 7–25)
CO2: 26 mmol/L (ref 20–32)
Calcium: 9.5 mg/dL (ref 8.6–10.3)
Chloride: 106 mmol/L (ref 98–110)
Creat: 1.16 mg/dL (ref 0.70–1.33)
GFR, Est African American: 82 mL/min/{1.73_m2} (ref >=60)
GFR, Est Non African American: 71 mL/min/{1.73_m2} (ref >=60)
Globulin: 2.5 g/dL (calc) (ref 1.9–3.7)
Glucose, Bld: 109 mg/dL — ABNORMAL HIGH (ref 65–99)
Potassium: 4.2 mmol/L (ref 3.5–5.3)
Sodium: 141 mmol/L (ref 135–146)
Total Bilirubin: 0.8 mg/dL (ref 0.2–1.2)
Total Protein: 7 g/dL (ref 6.1–8.1)

## 2019-04-12 LAB — SEDIMENTATION RATE: Sed Rate: 9 mm/h (ref 0–20)

## 2019-04-12 LAB — URIC ACID: Uric Acid, Serum: 5 mg/dL (ref 4.0–8.0)

## 2019-04-12 LAB — LDL CHOLESTEROL, DIRECT: Direct LDL: 74 mg/dL (ref <100)

## 2019-05-31 ENCOUNTER — Ambulatory Visit: Payer: 59 | Admitting: Sports Medicine

## 2019-05-31 ENCOUNTER — Encounter: Payer: Self-pay | Admitting: Sports Medicine

## 2019-05-31 ENCOUNTER — Other Ambulatory Visit: Payer: Self-pay

## 2019-05-31 DIAGNOSIS — M10362 Gout due to renal impairment, left knee: Secondary | ICD-10-CM

## 2019-05-31 MED ORDER — PREDNISONE 50 MG PO TABS: ORAL_TABLET | ORAL | 0 refills | Status: DC

## 2019-05-31 MED ORDER — TRAMADOL HCL 50 MG PO TABS: 50.0000 mg | ORAL_TABLET | Freq: Three times a day (TID) | ORAL | 0 refills | Status: DC | PRN

## 2019-05-31 NOTE — Progress Notes (Signed)
Subjective:    CC: Left foot pain  HPI: This is a pleasant 53 year old male, presented with a several day history of worsening pain and swelling in his left great toe, he does have a history of gout, recently injected about a month and a half ago into his first MTP.  He is taking colchicine twice daily now with the new flare, no fevers, chills, muscle aches, body aches, no trauma.  Symptoms are severe, persistent.  I reviewed the past medical history, family history, social history, surgical history, and allergies today and no changes were needed.  Please see the problem list section below in epic for further details.  Past Medical History: Past Medical History:  Diagnosis Date  . Back pain   . Chronic kidney disease (CKD) stage G3b/A1, moderately decreased glomerular filtration rate (GFR) between 30-44 mL/min/1.73 square meter and albuminuria creatinine ratio less than 30 mg/g 04/16/2011  . Gout   . Hepatitis C antibody test positive    Though has had neg tests as well.   . Hyperlipidemia   . Hypertension   . 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 CARPAL TUNNEL RELEASE  1979   left   . cyst removal on leg  1986  . KIDNEY STONE SURGERY  2009   laser  . staph infection surgery x 3    . THYROGLOSSAL DUCT CYST  1985   Social History: Social History   Socioeconomic History  . Marital status: Single    Spouse name: Not on file  . Number of children: Not on file  . Years of education: some colle  . Highest education level: Not on file  Occupational History  . Occupation: Economist     Comment: [employer]  Tobacco Use  . Smoking status: Never Smoker  . Smokeless tobacco: Current User  . Tobacco comment: Snuff/dip  Substance and Sexual Activity  . Alcohol use: Yes    Alcohol/week: 0.0 standard drinks    Comment: hasnt had anything in 3 weeks; normally has "none to  hardly"  . Drug use: No  . Sexual activity: Not on file  Other Topics Concern  . Not on file  Social History Narrative   No regular exercise.   Social Determinants of Health   Financial Resource Strain:   . Difficulty of Paying Living Expenses: Not on file  Food Insecurity:   . Worried About Charity fundraiser in the Last Year: Not on file  . Ran Out of Food in the Last Year: Not on file  Transportation Needs:   . Lack of Transportation (Medical): Not on file  . Lack of Transportation (Non-Medical): Not on file  Physical Activity:   . Days of Exercise per Week: Not on file  . Minutes of Exercise per Session: Not on file  Stress:   . Feeling of Stress : Not on file  Social Connections:   . Frequency of Communication with Friends and Family: Not on file  . Frequency of Social Gatherings with Friends and Family: Not on file  . Attends Religious Services: Not on file  . Active Member of Clubs or Organizations: Not on file  . Attends Archivist Meetings: Not on file  . Marital Status: Not on file   Family History: Family History  Problem Relation Age of Onset  . Breast cancer Unknown        aunt  .  Diabetes Unknown        grandfather  . Stroke Unknown        un[place]   . Diabetes Brother   . Hyperlipidemia Maternal Grandfather   . Heart attack Maternal Grandfather   . Other Unknown        small bowel cancer  . Colon cancer Neg Hx    Allergies: No Known Allergies Medications: See med rec.  Review of Systems: No fevers, chills, night sweats, weight loss, chest pain, or shortness of breath.   Objective:    General: Well Developed, well nourished, and in no acute distress.  Neuro: Alert and oriented x3, extra-ocular muscles intact, sensation grossly intact.  HEENT: Normocephalic, atraumatic, pupils equal round reactive to light, neck supple, no masses, no lymphadenopathy, thyroid nonpalpable.  Skin: Warm and dry, no rashes. Cardiac: Regular rate and rhythm,  no murmurs rubs or gallops, no lower extremity edema.  Respiratory: Clear to auscultation bilaterally. Not using accessory muscles, speaking in full sentences. Left foot: Swollen, minimally erythematous, tender first MTP.  X-rays personally reviewed, he does have baseline osteoarthritis as well.  Impression and Recommendations:    Gout attack Pain at the left first MTP, he does have osteoarthritis in the joint as well. Uric acid levels have been well controlled. This is a bit of a complicated situation as he does have stage III CKD, so we are limited in her use of NSAIDs. He will continue his colchicine for now, twice daily, adding 5 days of prednisone, he will go back into his boot for the next 2 weeks. Adding tramadol as well. Return to see me in 1 month, repeat injection +/- referral to foot and ankle surgery for MTP fusion versus referral to Dr. Raeford Razor for custom orthotics with first metatarsal ray posting if no better.   ___________________________________________ Gwen Her. Dianah Field, M.D., ABFM., CAQSM. Primary Care and Sports Medicine [facility]  Adjunct Professor of [employer]

## 2019-05-31 NOTE — Patient Instructions (Signed)
Boot for 2 weeks, return to see me in 4 weeks

## 2019-05-31 NOTE — Assessment & Plan Note (Addendum)
Pain at the left first MTP, he does have osteoarthritis in the joint as well. Uric acid levels have been well controlled. This is a bit of a complicated situation as he does have stage III CKD, so we are limited in her use of NSAIDs. He will continue his colchicine for now, twice daily, adding 5 days of prednisone, he will go back into his boot for the next 2 weeks. Adding tramadol as well. Return to see me in 1 month, repeat injection +/- referral to foot and ankle surgery for MTP fusion versus referral to Dr. Raeford Razor for custom orthotics with first metatarsal ray posting if no better.

## 2019-06-28 ENCOUNTER — Ambulatory Visit: Payer: 59 | Admitting: Sports Medicine

## 2019-08-13 LAB — HEMOGLOBIN A1C: A1c: 7.5

## 2019-08-13 LAB — CREATININE: Creat: 1.2

## 2019-10-19 ENCOUNTER — Ambulatory Visit (INDEPENDENT_AMBULATORY_CARE_PROVIDER_SITE_OTHER): Payer: 59 | Admitting: Family Medicine

## 2019-10-19 ENCOUNTER — Encounter: Payer: Self-pay | Admitting: Family Medicine

## 2019-10-19 VITALS — BP 124/84 | HR 92 | Ht 69.0 in | Wt 303.0 lb

## 2019-10-19 DIAGNOSIS — M10071 Idiopathic gout, right ankle and foot: Secondary | ICD-10-CM | POA: Diagnosis not present

## 2019-10-19 DIAGNOSIS — I10 Essential (primary) hypertension: Secondary | ICD-10-CM | POA: Diagnosis not present

## 2019-10-19 DIAGNOSIS — E118 Type 2 diabetes mellitus with unspecified complications: Secondary | ICD-10-CM | POA: Diagnosis not present

## 2019-10-19 MED ORDER — PREDNISONE 20 MG PO TABS: ORAL_TABLET | ORAL | 0 refills | Status: DC

## 2019-10-19 NOTE — Assessment & Plan Note (Signed)
Discussed getting back on track with diet and regular exercise.  Please see note above.

## 2019-10-19 NOTE — Progress Notes (Signed)
Established Patient Office Visit  Subjective:  Patient ID: Jared Harper, male    DOB: 10-21-1965  Age: 54 y.o. MRN: [id number]  CC:  Chief Complaint  Patient presents with  . Gout    HPI Jared Harper presents for follow up gout.  He has had frequent flares with gout.  He is currently on allopurinol daily.  He has seen both of our sports medicine docs previously for steroid injections directly into the joint.  He tolerates those well.  He does have prescription for colchicine but he uses it very rarely because of his renal function he does have CKD 3 he really tries to avoid other NSAIDs if at all possible.  He follows at the [facility] for his blood pressure and diabetes.  Hypertension- Pt denies chest pain, SOB, dizziness, or heart palpitations.  Taking meds as directed w/o problems.  Denies medication side effects.   Diabetes--no increased thirst or urination. No symptoms consistent with hypoglycemia.  He admits he has not been able to eat consistently and has not been going to the gym since Covid hit and especially since he is been working longer hours.   Past Medical History:  Diagnosis Date  . Back pain   . Chronic kidney disease (CKD) stage G3b/A1, moderately decreased glomerular filtration rate (GFR) between 30-44 mL/min/1.73 square meter and albuminuria creatinine ratio less than 30 mg/g 04/16/2011  . Gout   . Hepatitis C antibody test positive    Though has had neg tests as well.   . Hyperlipidemia   . Hypertension   . Kidney disease    only has one functioning kidney    Past Surgical History:  Procedure Laterality Date  . CARPAL TUNNEL RELEASE  1979   left   . cyst removal on leg  1986  . KIDNEY STONE SURGERY  2009   laser  . staph infection surgery x 3    . THYROGLOSSAL DUCT CYST  1985    Family History  Problem Relation Age of Onset  . Breast cancer Unknown        aunt  . Diabetes Unknown        grandfather  . Stroke Unknown        uncel   .  Diabetes Brother   . Hyperlipidemia Maternal Grandfather   . Heart attack Maternal Grandfather   . Other Unknown        small bowel cancer  . Colon cancer Neg Hx     Social History   Socioeconomic History  . Marital status: Single    Spouse name: Not on file  . Number of children: Not on file  . Years of education: some colle  . Highest education level: Not on file  Occupational History  . Occupation: Economist     Comment: [employer] [place]  Tobacco Use  . Smoking status: Never Smoker  . Smokeless tobacco: Current User  . Tobacco comment: Snuff/dip  Substance and Sexual Activity  . Alcohol use: Yes    Alcohol/week: 0.0 standard drinks    Comment: hasnt had anything in 3 weeks; normally has "none to hardly"  . Drug use: No  . Sexual activity: Not on file  Other Topics Concern  . Not on file  Social History Narrative   No regular exercise.   Social Determinants of Health   Financial Resource Strain:   . Difficulty of Paying Living Expenses:   Food Insecurity:   . Worried About Charity fundraiser in the  Last Year:   . [place] in the Last Year:   Transportation Needs:   . Film/video editor (Medical):   Marland Kitchen Lack of Transportation (Non-Medical):   Physical Activity:   . Days of Exercise per Week:   . Minutes of Exercise per Session:   Stress:   . Feeling of Stress :   Social Connections:   . Frequency of Communication with Friends and Family:   . Frequency of Social Gatherings with Friends and Family:   . Attends Religious Services:   . Active Member of Clubs or Organizations:   . Attends Archivist Meetings:   Marland Kitchen Marital Status:   Intimate Partner Violence:   . Fear of Current or Ex-Partner:   . Emotionally Abused:   Marland Kitchen Physically Abused:   . Sexually Abused:     Outpatient Medications Prior to Visit  Medication Sig Dispense Refill  . allopurinol (ZYLOPRIM) 100 MG tablet TAKE 2 TABLETS TWICE A DAY. 360 tablet 1  . Alogliptin  Benzoate 25 MG TABS Take 25 mg by mouth daily.    Marland Kitchen atorvastatin (LIPITOR) 40 MG tablet Take 1 tablet (40 mg total) by mouth daily. APPOINTMENT NEEDED FOR FURTHER REFILLS 90 tablet 1  . Colchicine 0.6 MG CAPS Take 1 capsule by mouth daily as needed (gout pain). 30 capsule 2  . valsartan (DIOVAN) 80 MG tablet TAKE 1 TABLET BY MOUTH DAILY. **APPOINTMENT REQUIRED FOR FUTURE REFILLS. PLEASE CALL OFFICE** 30 tablet 0  . aspirin 81 MG tablet Take 81 mg by mouth daily. Reported on 06/30/2015    . clotrimazole-betamethasone (LOTRISONE) cream Apply 1 application topically 2 (two) times daily. 60 g 0  . cyclobenzaprine (FLEXERIL) 10 MG tablet Take 1 tablet (10 mg total) by mouth 2 (two) times daily as needed for muscle spasms. 30 tablet 0  . HYDROcodone-acetaminophen (NORCO/VICODIN) 5-325 MG tablet Take 1 tablet by mouth every 6 (six) hours as needed. 15 tablet 0  . metFORMIN (GLUCOPHAGE) 1000 MG tablet Take 1,000 mg by mouth 2 (two) times daily with a meal.    . predniSONE (DELTASONE) 50 MG tablet One tab PO daily for 5 days. 5 tablet 0  . traMADol (ULTRAM) 50 MG tablet Take 1-2 tablets (50-100 mg total) by mouth every 8 (eight) hours as needed for moderate pain. Maximum 6 tabs per day. 21 tablet 0   No facility-administered medications prior to visit.    No Known Allergies  ROS Review of Systems    Objective:    Physical Exam  Constitutional: He is oriented to person, place, and time. He appears well-developed and well-nourished.  HENT:  Head: Normocephalic and atraumatic.  Eyes: Conjunctivae and EOM are normal.  Cardiovascular: Normal rate, regular rhythm and normal heart sounds.  Pulmonary/Chest: Effort normal and breath sounds normal.  Musculoskeletal:     Comments: Left great toe is mildly swollen at the base and tender on exam.  Some increased warmth is present as well no significant erythema.  Normal range of motion.  No rash or swelling over the left ankle or foot.  Neurological: He is  alert and oriented to person, place, and time.  Skin: Skin is warm and dry. No pallor.  Psychiatric: He has a normal mood and affect. His behavior is normal.  Vitals reviewed.   BP 124/84   Pulse 92   Ht 5\' 9"  (1.753 m)   Wt (!) 303 lb (137.4 kg)   SpO2 97%   BMI 44.75 kg/m  Wt  Readings from Last 3 Encounters:  10/19/19 (!) 303 lb (137.4 kg)  05/31/19 299 lb (135.6 kg)  04/11/19 294 lb (133.4 kg)     Health Maintenance Due  Topic Date Due  . PNEUMOCOCCAL POLYSACCHARIDE VACCINE AGE 32-64 HIGH RISK  Never done  . FOOT EXAM  Never done  . OPHTHALMOLOGY EXAM  Never done    There are no preventive care reminders to display for this patient.  Lab Results  Component Value Date   TSH 1.02 02/03/2018   Lab Results  Component Value Date   WBC 7.1 04/11/2019   HGB 14.0 04/11/2019   HCT 41.0 04/11/2019   MCV 91.1 04/11/2019   PLT 257 04/11/2019   Lab Results  Component Value Date   NA 141 04/11/2019   K 4.2 04/11/2019   CO2 26 04/11/2019   GLUCOSE 109 (H) 04/11/2019   BUN 12 04/11/2019   CREATININE 1.2 08/13/2019   BILITOT 0.8 04/11/2019   ALKPHOS 91 07/21/2016   AST 23 04/11/2019   ALT 36 04/11/2019   PROT 7.0 04/11/2019   ALBUMIN 5.0 06/30/2015   CALCIUM 9.5 04/11/2019   Lab Results  Component Value Date   CHOL 153 02/03/2018   Lab Results  Component Value Date   HDL 32 (A) 02/03/2018   Lab Results  Component Value Date   LDLCALC 78 02/03/2018   Lab Results  Component Value Date   TRIG 214 (A) 02/03/2018   Lab Results  Component Value Date   CHOLHDL 8.5 (H) 06/30/2015   Lab Results  Component Value Date   HGBA1C 5.8 (H) 04/11/2019      Assessment & Plan:   Problem List Items Addressed This Visit      Cardiovascular and Mediastinum   HYPERTENSION, BENIGN (Chronic)    Well controlled. Continue current regimen. Follow up in  6 mo        Endocrine   Controlled diabetes mellitus type 2 with complications ([place])    Reports his last A1c was  around 1.5 with the [place].  He admits that he has been working really long hours and so has not been eating the best and has not been eating consistently and also has not been to the gym since Covid hit.  We spent a fair amount of time discussing some strategies around getting back on track and setting some limits at work and also getting back into the gym he loves taking exercise classes it seems to be very motivating for him.  Make a big difference in his blood glucose control.  In fact his A1c 6 months ago was phenomenal at 5.8.      Relevant Medications   Alogliptin Benzoate 25 MG TABS     Other   Severe obesity (BMI >= 40) (HCC)    Discussed getting back on track with diet and regular exercise.  Please see note above.      Relevant Medications   Alogliptin Benzoate 25 MG TABS   Gout - Primary (Chronic)    Last uric acid 6 months ago looks phenomenal at 5.0.  So hopefully this will reduce the frequency of his flares unfortunately he is having an acute flare today of the left great toe.  He did take 2 colchicine today but does not like to take very many of them because of his impaired renal function he has CKD 3.  We will put him on oral prednisone for the next week to calm this down.  He has received  joint injections in the past as well.         Meds ordered this encounter  Medications  . predniSONE (DELTASONE) 20 MG tablet    Sig: Take 2 tablets (40 mg total) by mouth daily with breakfast for 5 days, THEN 1 tablet (20 mg total) daily with breakfast for 5 days.    Dispense:  15 tablet    Refill:  0    Follow-up: No follow-ups on file.    Beatrice Lecher, MD

## 2019-10-19 NOTE — Assessment & Plan Note (Signed)
Well controlled. Continue current regimen. Follow up in  6 mo  

## 2019-10-19 NOTE — Assessment & Plan Note (Signed)
Last uric acid 6 months ago looks phenomenal at 5.0.  So hopefully this will reduce the frequency of his flares unfortunately he is having an acute flare today of the left great toe.  He did take 2 colchicine today but does not like to take very many of them because of his impaired renal function he has CKD 3.  We will put him on oral prednisone for the next week to calm this down.  He has received joint injections in the past as well.

## 2019-10-19 NOTE — Assessment & Plan Note (Signed)
Reports his last A1c was around 1.5 with the [place].  He admits that he has been working really long hours and so has not been eating the best and has not been eating consistently and also has not been to the gym since Covid hit.  We spent a fair amount of time discussing some strategies around getting back on track and setting some limits at work and also getting back into the gym he loves taking exercise classes it seems to be very motivating for him.  Make a big difference in his blood glucose control.  In fact his A1c 6 months ago was phenomenal at 5.8.

## 2019-10-19 NOTE — Progress Notes (Signed)
Pt reports that he woke up this morning with pain in his L Great toe. He took IBU and tylenol and later took 2 Colchicine

## 2019-11-09 ENCOUNTER — Telehealth: Payer: Self-pay

## 2019-11-09 MED ORDER — PREDNISONE 20 MG PO TABS: ORAL_TABLET | ORAL | 0 refills | Status: DC

## 2019-11-09 NOTE — Telephone Encounter (Signed)
Patient advised. He states he does take the allopurinol daily.

## 2019-11-09 NOTE — Telephone Encounter (Signed)
Medication sent.  This is the second flare in a really short period of time.  Is he taking his allopurinol regularly?

## 2019-11-09 NOTE — Telephone Encounter (Signed)
Jared Harper states he has another gout flare up. He is requesting prednisone. Please advise.

## 2019-11-28 NOTE — Progress Notes (Signed)
   Complete physical exam  Patient: Jared Harper   DOB: 04/10/2000   54 y.o. Male  MRN: [id number]  Subjective:    No chief complaint on file.   Jared Harper is a 54 y.o. male who presents today for a complete physical exam. She reports consuming a {diet types:17450} diet. {types:19826} She generally feels {DESC; WELL/FAIRLY WELL/POORLY:18703}. She reports sleeping {DESC; WELL/FAIRLY WELL/POORLY:18703}. She {does/does not:200015} have additional problems to discuss today.    Most recent fall risk assessment:    12/16/2021   10:42 AM  Fall Risk   Falls in the past year? 0  Number falls in past yr: 0  Injury with Fall? 0  Risk for fall due to : No Fall Risks  Follow up Falls evaluation completed     Most recent depression screenings:    12/16/2021   10:42 AM 11/06/2020   10:46 AM  PHQ 2/9 Scores  PHQ - 2 Score 0 0  PHQ- 9 Score 5     {VISON DENTAL STD PSA (Optional):27386}  {History (Optional):23778}  Patient Care Team: Christpher Stogsdill, NP as PCP - General (Nurse Practitioner)   Outpatient Medications Prior to Visit  Medication Sig   fluticasone (FLONASE) 50 MCG/ACT nasal spray Place 2 sprays into both nostrils in the morning and at bedtime. After 7 days, reduce to once daily.   norgestimate-ethinyl estradiol (SPRINTEC 28) 0.25-35 MG-MCG tablet Take 1 tablet by mouth daily.   Nystatin POWD Apply liberally to affected area 2 times per day   spironolactone (ALDACTONE) 100 MG tablet Take 1 tablet (100 mg total) by mouth daily.   No facility-administered medications prior to visit.    ROS        Objective:     There were no vitals taken for this visit. {Vitals History (Optional):23777}  Physical Exam   No results found for any visits on 01/21/22. {Show previous labs (optional):23779}    Assessment & Plan:    Routine Health Maintenance and Physical Exam  Immunization History  Administered Date(s) Administered   DTaP 06/24/1999, 08/20/1999,  10/29/1999, 07/14/2000, 01/28/2004   Hepatitis A 11/24/2007, 11/29/2008   Hepatitis B 04/11/1999, 05/19/1999, 10/29/1999   HiB (PRP-OMP) 06/24/1999, 08/20/1999, 10/29/1999, 07/14/2000   IPV 06/24/1999, 08/20/1999, 04/18/2000, 01/28/2004   Influenza,inj,Quad PF,6+ Mos 03/01/2014   Influenza-Unspecified 05/31/2012   MMR 04/18/2001, 01/28/2004   Meningococcal Polysaccharide 11/29/2011   Pneumococcal Conjugate-13 07/14/2000   Pneumococcal-Unspecified 10/29/1999, 01/12/2000   Tdap 11/29/2011   Varicella 04/18/2000, 11/24/2007    Health Maintenance  Topic Date Due   HIV Screening  Never done   Hepatitis C Screening  Never done   INFLUENZA VACCINE  01/19/2022   PAP-Cervical Cytology Screening  01/21/2022 (Originally 04/09/2020)   PAP SMEAR-Modifier  01/21/2022 (Originally 04/09/2020)   TETANUS/TDAP  01/21/2022 (Originally 11/28/2021)   HPV VACCINES  Discontinued   COVID-19 Vaccine  Discontinued    Discussed health benefits of physical activity, and encouraged her to engage in regular exercise appropriate for her age and condition.  Problem List Items Addressed This Visit   None Visit Diagnoses     Annual physical exam    -  Primary   Cervical cancer screening       Need for Tdap vaccination          No follow-ups on file.     Fabrizio Filip, NP   

## 2019-11-29 ENCOUNTER — Ambulatory Visit: Payer: 59 | Admitting: Medical-Surgical

## 2019-12-04 ENCOUNTER — Other Ambulatory Visit: Payer: Self-pay

## 2019-12-04 ENCOUNTER — Encounter: Payer: Self-pay | Admitting: Family Medicine

## 2019-12-04 ENCOUNTER — Telehealth (INDEPENDENT_AMBULATORY_CARE_PROVIDER_SITE_OTHER): Payer: 59 | Admitting: Family Medicine

## 2019-12-04 DIAGNOSIS — M10362 Gout due to renal impairment, left knee: Secondary | ICD-10-CM | POA: Diagnosis not present

## 2019-12-04 MED ORDER — PREDNISONE 20 MG PO TABS: ORAL_TABLET | ORAL | 0 refills | Status: DC

## 2019-12-04 NOTE — Progress Notes (Signed)
Virtual Visit via Telephone Note  I connected with Jared Harper on 12/04/19 at  4:20 PM EDT by telephone and verified that I am speaking with the correct person using two identifiers.   I discussed the limitations, risks, security and privacy concerns of performing an evaluation and management service by telephone and the availability of in person appointments. I also discussed with the patient that there may be a patient responsible charge related to this service. The patient expressed understanding and agreed to proceed.  Patient location: Provider loccation: In office   Subjective:    CC: gout  HPI: sxs x 1 week on left distal foot.  Reports that he does take his allopurinol consistently he gets his prescriptions through the [place].  Since it started flaring about a week ago he has been taking a little bit of ibuprofen no more than 400-600 milligrams per day he has been trying to limit it because of his renal function.  He was hoping initially to get in with Dr. Dianah Field our sports medicine doctor for an injection.   Past medical history, Surgical history, Family history not pertinant except as noted below, Social history, Allergies, and medications have been entered into the medical record, reviewed, and corrections made.   Review of Systems: No fevers, chills, night sweats, weight loss, chest pain, or shortness of breath.   Objective:    General: Speaking clearly in complete sentences without any shortness of breath.  Alert and oriented x3.  Normal judgment. No apparent acute distress.    Impression and Recommendations:    Gout attack-we will treat with oral prednisone taper.  Can follow-up with Dr. Dianah Field if not improved after this week.  Avoid taking NSAIDs with prednisone as it can increase risk for GI ulcers and irritation and we discussed that today.  Make sure to take with food and water.  Continue allopurinol daily.  Aloe up with [place] physician for routine care for  gout.      I discussed the assessment and treatment plan with the patient. The patient was provided an opportunity to ask questions and all were answered. The patient agreed with the plan and demonstrated an understanding of the instructions.   The patient was advised to call back or seek an in-person evaluation if the symptoms worsen or if the condition fails to improve as anticipated.  I provided 15 minutes of non-face-to-face time during this encounter.   Beatrice Lecher, MD

## 2019-12-10 ENCOUNTER — Encounter: Payer: Self-pay | Admitting: Sports Medicine

## 2019-12-10 ENCOUNTER — Ambulatory Visit: Payer: 59 | Admitting: Sports Medicine

## 2019-12-10 DIAGNOSIS — M19072 Primary osteoarthritis, left ankle and foot: Secondary | ICD-10-CM | POA: Insufficient documentation

## 2019-12-10 NOTE — Progress Notes (Signed)
    Procedures performed today:    Procedure: Real-time Ultrasound Guided injection of the left first MTP Device: Samsung HS60  Verbal informed consent obtained.  Time-out conducted.  Noted no overlying erythema, induration, or other signs of local infection.  Skin prepped in a sterile fashion.  Local anesthesia: Topical Ethyl chloride.  With sterile technique and under real time ultrasound guidance:  1/2 cc lidocaine, 1/2 cc Kenalog 40 injected easily  completed without difficulty  Pain immediately resolved suggesting accurate placement of the medication.  Advised to call if fevers/chills, erythema, induration, drainage, or persistent bleeding.  Images permanently stored and available for review in the ultrasound unit.  Impression: Technically successful ultrasound guided injection.  Independent interpretation of notes and tests performed by another provider:   None.  Brief History, Exam, Impression, and Recommendations:    Osteoarthritis of first metatarsophalangeal (MTP) joint of left foot Jared Harper does have history of gout, he also has some osteoarthritis in the first MTP. He still has significant pain, so this was injected today, he had immediate relief which suggest that the primary generator of pain is osteoarthritis rather than gout. He will wear rigid shoes, I am also going to have him order a [employer] plate off of [address]. Return to see me in a month.     ___________________________________________ Gwen Her. Dianah Field, M.D., ABFM., CAQSM. Primary Care and [place] Instructor of [place] [facility]

## 2019-12-10 NOTE — Assessment & Plan Note (Signed)
Jared Harper does have history of gout, he also has some osteoarthritis in the first MTP. He still has significant pain, so this was injected today, he had immediate relief which suggest that the primary generator of pain is osteoarthritis rather than gout. He will wear rigid shoes, I am also going to have him order a [employer] plate off of [place]. Return to see me in a month.

## 2019-12-11 ENCOUNTER — Ambulatory Visit: Payer: 59 | Admitting: Sports Medicine

## 2020-01-07 ENCOUNTER — Ambulatory Visit: Payer: 59 | Admitting: Sports Medicine

## 2020-01-16 IMAGING — DX DG TOE GREAT 2+V*L*
3 series · 3 of 3 positions shown · non-contrast
Comparison: None.

CLINICAL DATA: Pain and swelling since [REDACTED].

EXAM:
LEFT GREAT TOE

[toe ap]
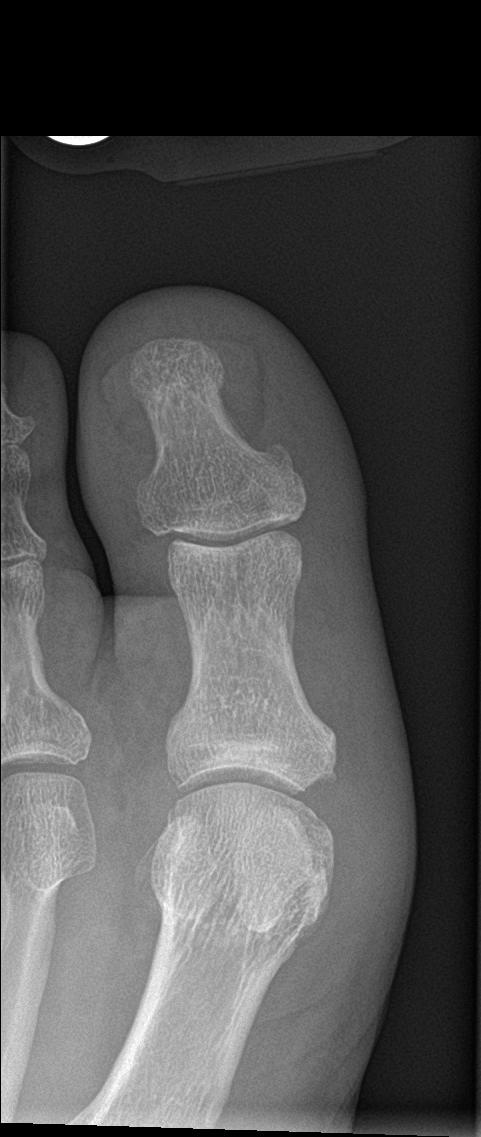

[toe obl]
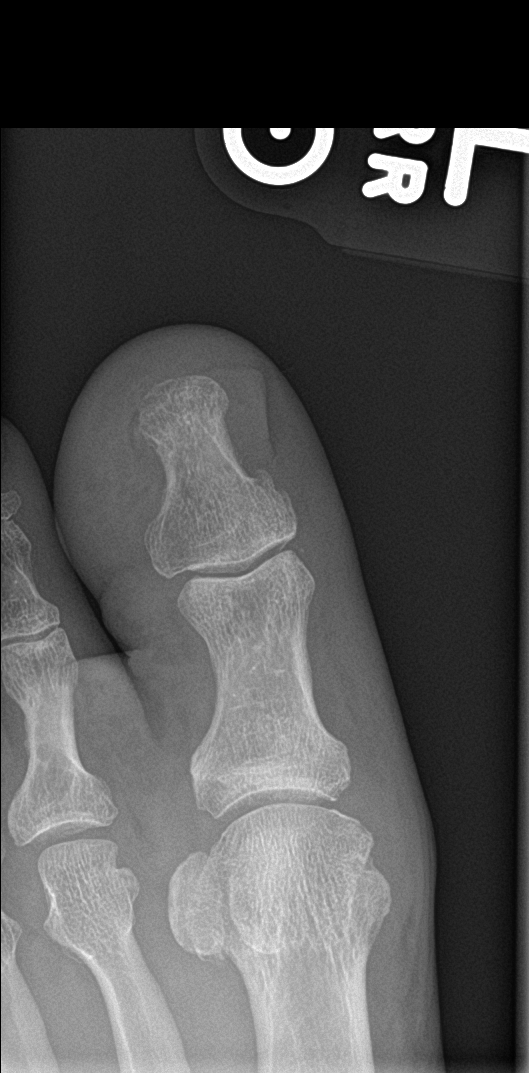

[toe lat]
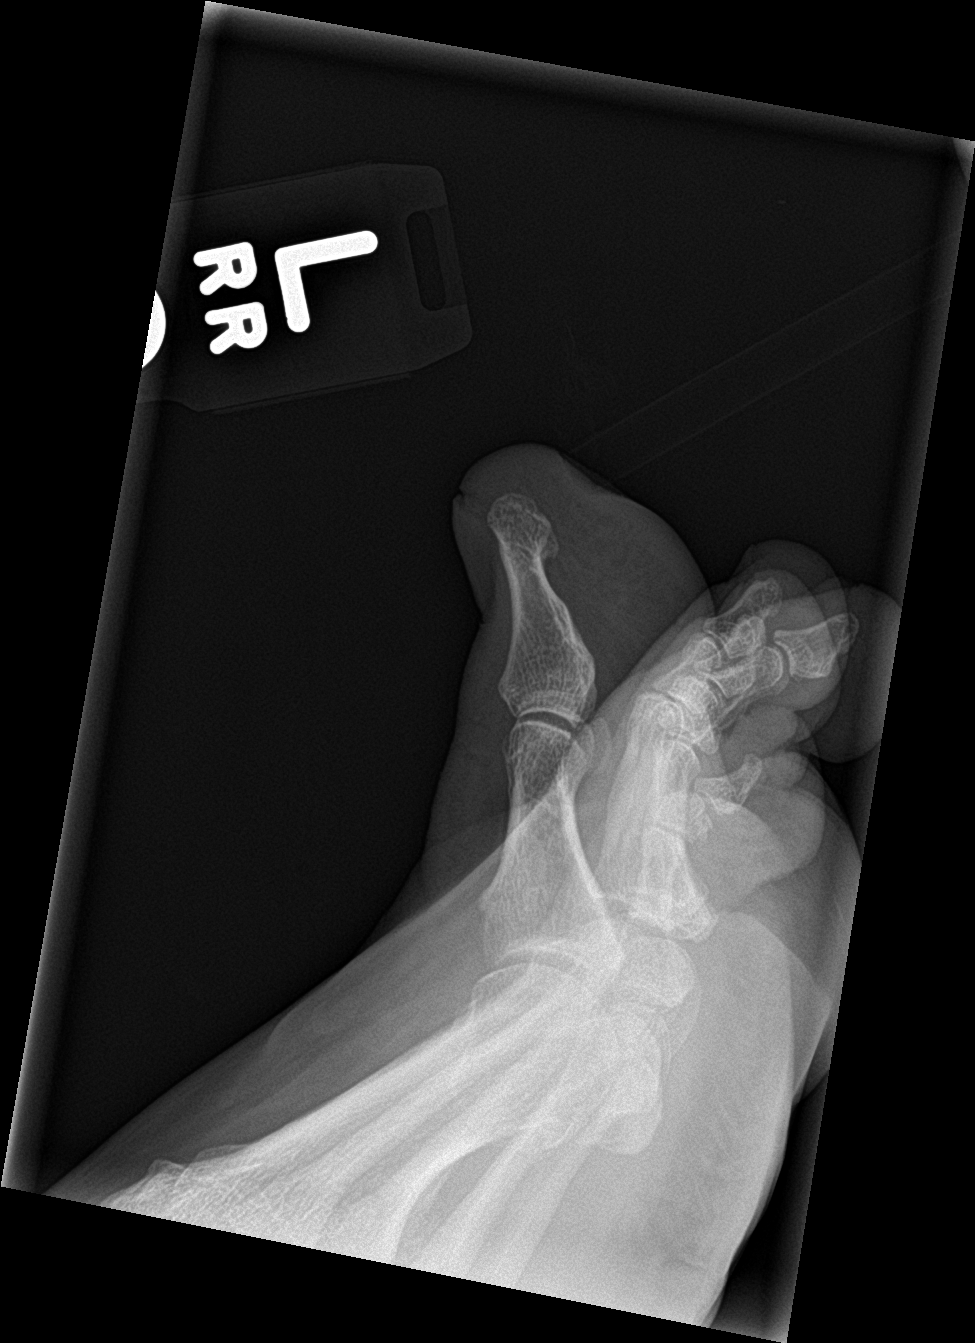

[3 of 3 positions shown; findings below may reference images not displayed]

FINDINGS: Nonspecific soft tissue swelling particularly in the region of the
MTP joint. I think there is a cortical erosion of the periarticular
bone at the medial corner.
IMPRESSION: Nonspecific soft tissue swelling in the region of the MTP joint of
the great toe. I think there is a cortical erosion of the
periarticular bone along the medial corner. This is compatible with
gout.
